# Patient Record
Sex: Female | Born: 1975
Health system: Southern US, Community
[De-identification: ages and names within clinical notes are randomized; demographics above are authoritative.]

## PROBLEM LIST (undated history)

## (undated) DIAGNOSIS — J329 Chronic sinusitis, unspecified: Secondary | ICD-10-CM

## (undated) DIAGNOSIS — G43909 Migraine, unspecified, not intractable, without status migrainosus: Secondary | ICD-10-CM

## (undated) DIAGNOSIS — N200 Calculus of kidney: Secondary | ICD-10-CM

## (undated) HISTORY — DX: Calculus of kidney: N20.0

## (undated) HISTORY — PX: ABDOMINAL HYSTERECTOMY: SHX81

## (undated) HISTORY — PX: NASAL SEPTUM SURGERY: SHX37

---

## 1998-02-06 ENCOUNTER — Other Ambulatory Visit: Admission: RE | Admit: 1998-02-06 | Discharge: 1998-02-06 | Payer: Self-pay | Admitting: Obstetrics and Gynecology

## 1998-03-27 ENCOUNTER — Ambulatory Visit (HOSPITAL_COMMUNITY): Admission: RE | Admit: 1998-03-27 | Discharge: 1998-03-27 | Payer: Self-pay | Admitting: Obstetrics and Gynecology

## 1999-10-08 ENCOUNTER — Emergency Department (HOSPITAL_COMMUNITY): Admission: EM | Admit: 1999-10-08 | Discharge: 1999-10-08 | Payer: Self-pay | Admitting: Emergency Medicine

## 2000-11-03 ENCOUNTER — Other Ambulatory Visit: Admission: RE | Admit: 2000-11-03 | Discharge: 2000-11-03 | Payer: Self-pay | Admitting: Obstetrics and Gynecology

## 2002-02-13 ENCOUNTER — Other Ambulatory Visit: Admission: RE | Admit: 2002-02-13 | Discharge: 2002-02-13 | Payer: Self-pay | Admitting: Obstetrics and Gynecology

## 2002-10-23 ENCOUNTER — Emergency Department (HOSPITAL_COMMUNITY): Admission: EM | Admit: 2002-10-23 | Discharge: 2002-10-23 | Payer: Self-pay | Admitting: Emergency Medicine

## 2003-04-13 ENCOUNTER — Other Ambulatory Visit: Admission: RE | Admit: 2003-04-13 | Discharge: 2003-04-13 | Payer: Self-pay | Admitting: Obstetrics and Gynecology

## 2003-06-15 ENCOUNTER — Emergency Department (HOSPITAL_COMMUNITY): Admission: EM | Admit: 2003-06-15 | Discharge: 2003-06-15 | Payer: Self-pay | Admitting: Emergency Medicine

## 2003-06-16 ENCOUNTER — Emergency Department (HOSPITAL_COMMUNITY): Admission: EM | Admit: 2003-06-16 | Discharge: 2003-06-16 | Payer: Self-pay | Admitting: Emergency Medicine

## 2003-09-02 ENCOUNTER — Emergency Department (HOSPITAL_COMMUNITY): Admission: AD | Admit: 2003-09-02 | Discharge: 2003-09-02 | Payer: Self-pay | Admitting: Internal Medicine

## 2003-09-03 ENCOUNTER — Ambulatory Visit (HOSPITAL_COMMUNITY): Admission: RE | Admit: 2003-09-03 | Discharge: 2003-09-03 | Payer: Self-pay | Admitting: Internal Medicine

## 2003-10-16 ENCOUNTER — Encounter (INDEPENDENT_AMBULATORY_CARE_PROVIDER_SITE_OTHER): Payer: Self-pay | Admitting: *Deleted

## 2003-10-16 ENCOUNTER — Ambulatory Visit (HOSPITAL_COMMUNITY): Admission: RE | Admit: 2003-10-16 | Discharge: 2003-10-16 | Payer: Self-pay | Admitting: Obstetrics and Gynecology

## 2004-08-12 ENCOUNTER — Observation Stay (HOSPITAL_COMMUNITY): Admission: RE | Admit: 2004-08-12 | Discharge: 2004-08-13 | Payer: Self-pay | Admitting: Obstetrics and Gynecology

## 2004-08-12 ENCOUNTER — Encounter (INDEPENDENT_AMBULATORY_CARE_PROVIDER_SITE_OTHER): Payer: Self-pay | Admitting: Specialist

## 2004-10-02 ENCOUNTER — Emergency Department (HOSPITAL_COMMUNITY): Admission: EM | Admit: 2004-10-02 | Discharge: 2004-10-02 | Payer: Self-pay | Admitting: Emergency Medicine

## 2006-01-27 ENCOUNTER — Emergency Department (HOSPITAL_COMMUNITY): Admission: EM | Admit: 2006-01-27 | Discharge: 2006-01-27 | Payer: Self-pay | Admitting: Emergency Medicine

## 2006-03-09 ENCOUNTER — Emergency Department (HOSPITAL_COMMUNITY): Admission: EM | Admit: 2006-03-09 | Discharge: 2006-03-10 | Payer: Self-pay | Admitting: Emergency Medicine

## 2006-03-12 ENCOUNTER — Emergency Department (HOSPITAL_COMMUNITY): Admission: EM | Admit: 2006-03-12 | Discharge: 2006-03-12 | Payer: Self-pay | Admitting: Emergency Medicine

## 2006-03-15 ENCOUNTER — Ambulatory Visit (HOSPITAL_COMMUNITY): Admission: RE | Admit: 2006-03-15 | Discharge: 2006-03-15 | Payer: Self-pay | Admitting: Urology

## 2006-07-16 ENCOUNTER — Emergency Department (HOSPITAL_COMMUNITY): Admission: EM | Admit: 2006-07-16 | Discharge: 2006-07-16 | Payer: Self-pay | Admitting: Emergency Medicine

## 2007-05-06 ENCOUNTER — Emergency Department (HOSPITAL_COMMUNITY): Admission: EM | Admit: 2007-05-06 | Discharge: 2007-05-06 | Payer: Self-pay | Admitting: Emergency Medicine

## 2008-08-09 ENCOUNTER — Emergency Department (HOSPITAL_COMMUNITY): Admission: EM | Admit: 2008-08-09 | Discharge: 2008-08-09 | Payer: Self-pay | Admitting: Family Medicine

## 2008-09-01 ENCOUNTER — Emergency Department (HOSPITAL_COMMUNITY): Admission: EM | Admit: 2008-09-01 | Discharge: 2008-09-01 | Payer: Self-pay | Admitting: Emergency Medicine

## 2008-10-03 ENCOUNTER — Emergency Department (HOSPITAL_COMMUNITY): Admission: EM | Admit: 2008-10-03 | Discharge: 2008-10-03 | Payer: Self-pay | Admitting: Family Medicine

## 2009-04-05 ENCOUNTER — Emergency Department (HOSPITAL_COMMUNITY): Admission: EM | Admit: 2009-04-05 | Discharge: 2009-04-05 | Payer: Self-pay | Admitting: Adult Health

## 2009-07-18 ENCOUNTER — Emergency Department (HOSPITAL_COMMUNITY): Admission: EM | Admit: 2009-07-18 | Discharge: 2009-07-18 | Payer: Self-pay | Admitting: Emergency Medicine

## 2010-08-26 ENCOUNTER — Emergency Department (HOSPITAL_COMMUNITY)
Admission: EM | Admit: 2010-08-26 | Discharge: 2010-08-26 | Payer: Self-pay | Source: Home / Self Care | Admitting: Family Medicine

## 2010-11-11 LAB — POCT RAPID STREP A (OFFICE): Streptococcus, Group A Screen (Direct): NEGATIVE

## 2010-11-25 LAB — POCT RAPID STREP A (OFFICE): Streptococcus, Group A Screen (Direct): NEGATIVE

## 2010-12-11 ENCOUNTER — Ambulatory Visit: Payer: Self-pay | Admitting: *Deleted

## 2010-12-26 NOTE — H&P (Signed)
Janet [name], Janet [name]            ACCOUNT NO.:  [id number]   MEDICAL RECORD NO.:  [id number]          PATIENT TYPE:  INP   LOCATION:  NA                            FACILITY:  [facility]   PHYSICIAN:  Dineen Kid. Rana Snare, M.D.    DATE OF BIRTH:  August 23, 1975   DATE OF ADMISSION:  08/12/2004  DATE OF DISCHARGE:                                HISTORY & PHYSICAL   HISTORY OF PRESENT ILLNESS:  Janet [name] is a 34 year old G1, P1 with  history of worsening pelvic pain that did not improve with previous tubal  ligation and laparoscopy in March 2005 which included lysis of adhesions and  ablation of endometriosis implants and also cystectomy.  She also has a  history of recurrent right ovarian cyst.  She does not tolerate birth  control pills and even the NuvaRing causes significant side effects.  She is  currently on Motrin and Darvocet and finds that she still has a significant  amount of pain.  She desires more definitive surgical intervention and  presents for laparoscopic assisted vaginal hysterectomy and also right  salpingo-oophorectomy.  She is frustrated and hurts all the time and is  missing work because of the pelvic pain and the discomfort.  She furthermore  has been offered other considerations such a Depo-Provera or the Evra Lupron  Depot or repeat laparoscopy but desires hysterectomy.   PAST MEDICAL HISTORY:  Her past medical history is significant for  depression and anxiety currently on 200 mg of Zoloft and 0.5 mg of Klonopin  b.i.d. as needed.   PAST SURGICAL HISTORY:  Past surgical history is significant for a  laparoscopy with lysis of adhesions, ovarian cystectomy, and bilateral tubal  ligation in March 2005.   PAST OBSTETRICAL HISTORY:  She had a vaginal birth of daughter Janet [name] in  20.   ALLERGIES:  She has allergy to CIPRO and MACROBID.   PHYSICAL EXAMINATION:  VITALS:  Blood pressure is 100/78, weight is 121.  HEART:  Regular in rhythm.  LUNGS:  Clear to auscultation  bilaterally.  ABDOMEN:  Nondistended, nontender.  PELVIC EXAM:  Uterus is anteverted, mobile, nontender, no adnexal masses are  palpable, no uterosacral nodularities palpable.   IMPRESSION AND PLAN:  Dysmenorrhea, pelvic pain, menometrorrhagia all not  responsive to conservative medical management which has included hormonal  manipulation as well as laparoscopy.  Patient desires definitive surgical  treatment planned vaginal hysterectomy with right salpingo-oophorectomy.  The risks and benefits were discussed at length.  Alternative treatments  were discussed at length.  Patient does give her informed consent and wished  to proceed.  The risks include, but are not limited to, risk of infection,  bleeding, damage to bowel, bladder, ureters, left ovary, possibility this  may not alleviate the pelvic pain, risks associated with surgery, risks  associated with blood transfusion.  She does give her informed consent and  wished to proceed.    Davi  DCL/MEDQ  D:  08/11/2004  T:  08/11/2004  Job:  [id number]

## 2010-12-26 NOTE — Op Note (Signed)
Janet, [name]            ACCOUNT NO.:  [id number]   MEDICAL RECORD NO.:  [id number]          PATIENT TYPE:  INP   LOCATION:  9399                          FACILITY:  [facility]   PHYSICIAN:  Dineen Kid. Rana Snare, M.D.    DATE OF BIRTH:  [birth year]   DATE OF PROCEDURE:  08/12/2004  DATE OF DISCHARGE:                                 OPERATIVE REPORT   PREOPERATIVE DIAGNOSES:  1.  Pelvic pain.  2.  Dysmenorrhea.  3.  Menometrorrhagia.   POSTOPERATIVE DIAGNOSES:  1.  Pelvic pain.  2.  Dysmenorrhea.  3.  Menometrorrhagia.  4.  Left ovarian cyst.  5.  Pelvic adhesions.   PROCEDURE:  Laparoscopic-assisted vaginal hysterectomy with right salpingo-  oophorectomy, left ovarian cystectomy, and lysis of adhesions.   SURGEON:  Dineen Kid. Rana Snare, M.D.   ASSISTANT:  Raynald Kemp, M.D.   ANESTHESIA:  General endotracheal.   INDICATIONS:  [name] is a 35 year old G1, P1, with worsening pelvic  pain which has not been improved by conservative management which has  included previous laparoscopies.  Currently on pain medication, including  Darvocet and Motrin.  She also has a history of right recurrent ovarian cyst  and cannot tolerate birth control pills.  She desires definitive surgical  intervention.  Plans laparoscopic-assisted vaginal hysterectomy with right  salpingo-oophorectomy and try to preserve the left ovary.  Risks and  benefits were discussed at length.  Informed consent was obtained.  Risks  include but are not limited to risk of infection, bleeding, damage to bowel,  bladder, ureters, left ovary, the possibility that this may not alleviate  the pelvic pain, risks associated with surgery, risks associated with  general anesthesia, and also blood transfusion.  She gives her informed  consent and wished to proceed.   FINDINGS AT THE TIME OF SURGERY:  Left ovarian cyst.  Left ovary was adhered  to the left pelvic sidewall and also the descending colon.  Right ovary did  appear to  be normal.  The uterus had several small fibroids, normal-  appearing appendix, normal-appearing liver.   DESCRIPTION OF PROCEDURE:  After adequate analgesia, the patient was placed  in the dorsal lithotomy position.  She was sterilely prepped and draped.  The bladder was sterilely drained.  A Hulka tenaculum was placed on the  cervix.  A 1 cm infraumbilical skin incision was made.  A Veress needle was  inserted.  The abdomen was insufflated, with dullness to percussion.  An 11  mm trocar was inserted.  A 5 mm trocar was inserted to the left of the  midline, two fingerbreadths above the pubic symphysis.  The above findings  were noted.  A __________ tripolar ligature was used to dissect and  coagulate the left ovary from the left pelvic sidewall.  Left ovarian  cystectomy was performed using the __________, with good hemostasis  achieved, and the descending colon adhesions were sharply lysed and made  hemostatic with the bipolar from the left tubo-ovarian complex, with good  hemostasis achieved, the left ovary now appearing essentially normal.  The  left broad ligament was dissected across  the uteroovarian ligament, down  across the round ligament using the tripolar ligature.  The right  infundibulopelvic ligament was ligated and dissected down across the round  ligament.  The uterovesical junction was noted, was elevated with a grasper,  and a small bladder flap was created.  The legs were repositioned.  The  abdomen was desufflated.  A weighted speculum was placed in the vagina.  A  Jacobs tenaculum was placed on the posterior cervix.  A posterior colpotomy  was performed.  The cervix was circumscribed with Bovie cautery.  A ligature  ligating instrument was used to coagulate across the uterosacral ligaments  bilaterally, dissected with Mayo scissors.  This was done similarly across  the cardinal ligaments.  Bladder pillars were similarly dissected.  The  bladder was dissected off the  anterior surface of the uterus.  Anterior  peritoneum was entered, and a Deaver retractor was placed.  Successive bites  up the inferior portion of the broad ligament were performed using the  ligature and Mayo scissors.  The uterine fundus was grasped, delivered down  to the introitus, and the remaining portion of the pedicles were clamped,  cut, and the uterus and the right ovary were removed.  The uterosacral  ligaments were identified, ligated with 0 Monocryl sutures in figure-of-  eight fashion.  The posterior peritoneum was then closed in a pursestring  fashion.  The vagina was then closed in a vertical fashion using figure-of-  eights and 0 Monocryl suture, with good approximation and good hemostasis.  Foley catheter was placed, with return of clear yellow urine.  The abdomen  was reinsufflated.  Legs were repositioned.  Bipolar cautery was used to  coagulate the peritoneal edges for good hemostasis, and after assurance of  hemostasis throughout the cul-de-sac and pelvis, the left ovary was further  examined, and after assuring hemostasis of the left ovarian cystectomy,  INTERCEED adhesion barrier was placed across the pelvic sidewall just below  the ovary in the left ovarian fossa.  The remaining portion of the INTERCEED  was placed across the anterior portion of the pelvic incision, covering the  anterior peritoneum.  The abdomen was desufflated.  Trocars were removed.  The previous scarring from the umbilical incision was sharply excised.  The  umbilical incision was closed with 0 Vicryl in an interrupted suture of the  fascia and a 3-0 Vicryl Rapide subcuticular stitch, with good approximation  and good hemostasis.  The 5 mm site was closed with two sutures of  subcuticular suture and interrupted suture of 3-0 Vicryl Rapide.  The  incisions were injected with 0.25% Marcaine, 10 cc total used.  The patient  tolerated the procedure well and was stable on transfer to the  recovery room.  Sponge and instrument count was normal x 3.   ESTIMATED BLOOD LOSS:  100 cc.   The patient received 1 g of Rocephin preoperatively.     Davi   DCL/MEDQ  D:  08/12/2004  T:  08/12/2004  Job:  [id number]

## 2010-12-26 NOTE — H&P (Signed)
[name], [name]                      ACCOUNT NO.:  [id number]   MEDICAL RECORD NO.:  [id number]                   PATIENT TYPE:  AMB   LOCATION:  SDC                                  FACILITY:  [facility]   PHYSICIAN:  Dineen Kid. Rana Snare, M.D.                 DATE OF BIRTH:  Mar 14, 1976   DATE OF ADMISSION:  10/16/2003  DATE OF DISCHARGE:                                HISTORY & PHYSICAL   HISTORY OF PRESENT ILLNESS:  Janet [name] complains of right-sided pain, going  on for the last three or four weeks, becoming more severe, currently taking  pain medication for this which is affecting her on a day-to-day basis.  She  has been to Dr. Annabell Howells for evaluation of this, does have a history of kidney  stones.  She underwent a CT scan and pelvic ultrasound showing a right  ovarian cyst.  She does have bilateral ovarian cysts with a largest on the  right measuring 3.1 cm in size.  No distal occlusion or renal calculi are  noted.  She does have several small kidney stones, however.  She also  adamantly desires sterilization.  She currently has a [place] IUD and has  achieved amenorrhea with IUD; however, wants to have a bilateral tubal  ligation performed at the same time ovarian cystectomy is performed.   PHYSICAL EXAMINATION:  HEART:  Regular rate and rhythm.  LUNGS:  Clear to auscultation bilaterally.  ABDOMEN:  Nondistended, nontender.  PELVIC:  The uterus is anteverted, mobile, nontender.  The right adnexa is  mildly tender to deep palpation and mildly full to deep palpation.  No  rebound or guarding is noted.  IUD string was in place.   IMPRESSION AND PLAN:  Right ovarian cyst.  Discussed different options with  her.  She does have Marina IUD.  She desires definitive surgical evaluation  and treatment of the right ovary and preferred right ovarian cystectomy.  She also has a history of pelvic pain and adhesions.  She also desires  sterilization and wants to proceed with this.  She would like to  leave the  [place] IUD in place, however, because she is doing well gynecologically and  not having any periods with this. She understands that cyst may recur.  There is also risk of injury to the uterus, tubes, ovaries, bowel, bladder.  The risk of tubal failure quoted at 5 out of 1000.  Risks associated with  anesthesia and  loss, possible laparotomy.  She does given her informed  consent.                                               Dineen Kid Rana Snare, M.D.    DCL/MEDQ  D:  10/15/2003  T:  10/15/2003  Job:  [id number]

## 2010-12-26 NOTE — Discharge Summary (Signed)
[name], [name]            ACCOUNT NO.:  [id number]   MEDICAL RECORD NO.:  [id number]          PATIENT TYPE:  INP   LOCATION:  9317                          FACILITY:  [facility]   PHYSICIAN:  Dineen Kid. Rana Snare, M.D.    DATE OF BIRTH:  1976/07/26   DATE OF ADMISSION:  08/12/2004  DATE OF DISCHARGE:  08/13/2004                                 DISCHARGE SUMMARY   HISTORY OF PRESENT ILLNESS:  [name] is a 35 year old G1, P1 with  worsening pelvic pain, that has not been improved with conservative medical  management, including previous laparoscopies.  She is currently on pain  medication, including Darvocet and Motrin.  She also has a history of right  ovarian cysts, which are recurrent.  Cannot tolerate birth control pills.  She desires definitive surgical intervention; plan hysterectomy with removal  of right tube and ovary.   The risks and benefits were discussed, informed consent was obtained.  See  the history and physical for further details.   HOSPITAL COURSE:  The patient underwent laparoscopically-assisted vaginal  hysterectomy with right salpingo-oophorectomy.  At the time of surgery it  was also noted to have a left ovarian cyst.  Cystectomy was performed.  There were also adhesions which were removed.   The surgery was uncomplicated, with estimated blood loss of 100 cc.  The  patient's postoperative course was similarly uncomplicated, with quick  return of bowel function and the patient was ambulating without difficulty.  She was tolerating a regular diet by postoperative day #1.  Her incision was  clean and dry and intact.  The patient was discharged home; to follow in the  office in  1-2 weeks.   DISPOSITION:  The patient will follow up in the office in 1-2 weeks.  She is  sent home with a prescription for Darvocet #30.  Told to return for  increased pain, fever or bleeding.   Her postoperative hemoglobin was 11.3.     Davi   DCL/MEDQ  D:  08/13/2004  T:   08/13/2004  Job:  [id number]

## 2010-12-26 NOTE — Op Note (Signed)
Janet [name], Janet [name]                      ACCOUNT NO.:  [id number]   MEDICAL RECORD NO.:  [id number]                   PATIENT TYPE:  AMB   LOCATION:  SDC                                  FACILITY:  [facility]   PHYSICIAN:  Dineen Kid. Rana Snare, M.D.                 DATE OF BIRTH:  March 15, 1976   DATE OF PROCEDURE:  10/16/2003  DATE OF DISCHARGE:                                 OPERATIVE REPORT   PREOPERATIVE DIAGNOSES:  1. Bilateral ovarian cysts.  2. Pelvic pain.  3. History of adhesive disease.  4. Desires sterility.   POSTOPERATIVE DIAGNOSES:  1. Pelvic pain.  2. Bilateral ovarian cysts.  3. Adhesions.  4. Mild endometriosis.  5. Desires sterility.   PROCEDURES:  1. Laparoscopy with drainage of right ovarian cyst and left ovarian     cystectomy.  2. Left salpingectomy.  3. Right tubal ligation.  4. Lysis of adhesions.  5. Ablation of endometriosis implants.   SURGEON:  Dineen Kid. Rana Snare, M.D.   ANESTHESIA:  General endotracheal.   ESTIMATED BLOOD LOSS:  Less than 50 mL.   INDICATIONS:  [name] is a 35 year old G2, P2, with worsening pelvic  pain, who recently had a CT scan done which showed a right ovarian cyst.  A  follow-up ultrasound showed bilateral ovarian cysts with the largest on the  right side, measuring 3.1 cm.  She has a history of pelvic adhesions and  because of worsening pain, she also desires sterilization despite having a  Mirena IUD in place.  She wished to proceed with laparoscopic evaluation of  the pelvic pain and at the same time proceed with bilateral tubal ligation.  Risks and benefits were discussed at length, which include but are not  limited to risk of infection, bleeding, damage to uterus, tubes, and  ovaries, bowel and bladder, possibility of recurrence of pelvic pain or not  being able to alleviate the pelvic pain.  The patient was also told that the  failure rate of tubal ligation is 5 out of 1000.  She does give her informed  consent and  wishes to proceed.   FINDINGS AT THE TIME OF SURGERY:  A normal-appearing appendix and liver.  The right ovary has a small follicular cyst, which was easily drained.  There were several small endometriosis implants in the right ovarian fossa.  A small 2-3 cm in the posterior cervix.  A normal-appearing cul-de-sac and  bladder.  The left ovarian-tubal complex was adhered to the pelvic sidewall  and to the bowel.  The cyst was moderately enlarged with two cysts, one  hemorrhagic and one serous in nature.   DESCRIPTION OF PROCEDURE:  After adequate analgesia, the patient was placed  in the dorsal lithotomy position.  She was sterilely prepped and draped.  The bladder was sterilely drained.  A Graves speculum was placed and the  tenaculum was placed on the anterior lip of the cervix.  A 1 cm  infraumbilical skin incision was made, a Veress needle was inserted.  The  abdomen was insufflated to dullness to percussion.  An 11 mm trocar was  inserted.  A 5 mm trocar was inserted to the left of midline two  fingerbreadths above the pubic symphysis under direct visualization.  The  above findings were noted.  The right ovary was identified.  A small  follicular cyst was identified, and bipolar cautery was used to open the  cyst with drainage of follicular fluid.  Because this appeared to be a very  simple follicular cyst, no further excision of the cyst wall was indicated.  There were several small areas of endometriosis implants underneath the  right ovary in the ovarian fossa.  These were ablated with bipolar cautery,  good thermal burn noted through the entirety of the peritoneal implant with  care taken to avoid the underlying ureter.  The right fallopian tube was  then identified.  A midportion was grasped and cauterization was carried out  across approximately a 2-3 cm section of the fallopian tube with a good  thermal burn noted across the entirety of the tube.  Examination of the left   tubo-ovarian complex revealed a marked amount of bowel adhesions to the tube  and the ovary.  These were dissected with Endoshears and bipolar cautery.  The fallopian tube was densely adhered to the bowel as well as the ovary.  This was sharply freed up with Endoshears and hemostasis achieved with  bipolar cautery.  However, because the fallopian tube mesosalpinx was  markedly traumatized due to the adhesions, a decision was made to proceed  with a partial salpingectomy on the right side.  The cautery was then  carried up the mesosalpinx to the midportion of the tube, where after  cauterization the fallopian tube was excised.  The ovary was then freed from  the bowel and part of the pelvic sidewall using Endoshears and bipolar  cautery with care taken not to undermine the infundibulopelvic ligaments and  blood supply or undermine ureter.  The ovarian cysts were grasped and  bipolar cautery was used to make a small window.  Serous fluid was removed.  The wall from the serous cyst was grasped with graspers and delivered  through the incision.  Hemostasis was achieved with bipolar cautery around  the opening of the serous cyst and most of the cyst wall was removed and  sent to pathology.  A second cyst within another part of the ovary was  identified.  A small hole was made through the exterior surface of the ovary  with bipolar cautery.  It appeared to be a hemorrhagic cyst with graspers  removing a portion of clot and cyst wall.  Hemostasis was achieved with  bipolar cautery.  Re-examination of the pelvis and cul-de-sac at this point  appeared to have been restored to normal.  After a copious amount of  irrigation, adequate hemostasis was assured.  Interceed adhesion barrier was  placed along the left pelvic sidewall where the adhesions were removed and  the left ovary was also wrapped with Interceed to help prevent adhesions. The abdomen was then desufflated, the trocars removed.  The  infraumbilical  skin incision was closed with a 0 Vicryl figure-of-eight in the fascia, a 3-  0 Vicryl Rapide subcuticular stitch.  The 5 mm site was closed with a 3-0  Vicryl Rapide subcuticular stitch and also a horizontal mattress of 3-0  Vicryl Rapide.  The  incisions were injected with 0.25% Marcaine 10 mL total  used.  The tenaculum was removed from the cervix and noted to be hemostatic.  The patient tolerated the procedure well, was stable on transfer to the  recovery room.  Sponge and instrument count was normal x3.  Estimated blood  loss was less than 50 mL.   DISPOSITION:  The patient will be discharged home with follow-up in the  office in two to three weeks, sent home with a routine instruction sheet for  laparoscopy.  Told to return for increased pain, fever, or bleeding.  The  patient did receive 1 g of Cefotetan preoperatively.                                               Dineen Kid Rana Snare, M.D.    DCL/MEDQ  D:  10/16/2003  T:  10/16/2003  Job:  [id number]

## 2011-05-15 LAB — POCT RAPID STREP A: Streptococcus, Group A Screen (Direct): NEGATIVE

## 2011-05-21 LAB — URINALYSIS, ROUTINE W REFLEX MICROSCOPIC
Bilirubin Urine: NEGATIVE
Glucose, UA: NEGATIVE
Hgb urine dipstick: NEGATIVE
Ketones, ur: NEGATIVE
Leukocytes, UA: NEGATIVE
Nitrite: NEGATIVE
Protein, ur: 30 — AB
Specific Gravity, Urine: 1.019
Urobilinogen, UA: 1
pH: 6

## 2011-05-21 LAB — URINE MICROSCOPIC-ADD ON

## 2011-05-21 LAB — PREGNANCY, URINE: Preg Test, Ur: NEGATIVE

## 2012-01-08 ENCOUNTER — Other Ambulatory Visit: Payer: Self-pay | Admitting: Family Medicine

## 2012-01-08 DIAGNOSIS — J329 Chronic sinusitis, unspecified: Secondary | ICD-10-CM

## 2012-01-11 ENCOUNTER — Other Ambulatory Visit (HOSPITAL_COMMUNITY): Payer: Self-pay

## 2012-01-11 ENCOUNTER — Ambulatory Visit: Payer: Self-pay | Admitting: Family Medicine

## 2012-01-27 ENCOUNTER — Ambulatory Visit: Payer: Self-pay | Admitting: Otolaryngology

## 2012-02-04 ENCOUNTER — Inpatient Hospital Stay: Payer: Self-pay | Admitting: Internal Medicine

## 2012-02-04 LAB — COMPREHENSIVE METABOLIC PANEL
Albumin: 2.7 g/dL — ABNORMAL LOW (ref 3.4–5.0)
Alkaline Phosphatase: 116 U/L (ref 50–136)
Bilirubin,Total: 0.5 mg/dL (ref 0.2–1.0)
Calcium, Total: 8 mg/dL — ABNORMAL LOW (ref 8.5–10.1)
Chloride: 98 mmol/L (ref 98–107)
EGFR (African American): >60
EGFR (Non-African Amer.): >60
Glucose: 115 mg/dL — ABNORMAL HIGH (ref 65–99)
Potassium: 2.8 mmol/L — ABNORMAL LOW (ref 3.5–5.1)

## 2012-02-04 LAB — CBC
MCH: 30 pg (ref 26.0–34.0)
MCHC: 35.1 g/dL (ref 32.0–36.0)
MCV: 86 fL (ref 80–100)
Platelet: 206 10*3/uL (ref 150–440)
RBC: 4.56 10*6/uL (ref 3.80–5.20)
WBC: 27.2 10*3/uL — ABNORMAL HIGH (ref 3.6–11.0)

## 2012-02-04 LAB — URINALYSIS, COMPLETE
Blood: NEGATIVE
Glucose,UR: NEGATIVE mg/dL (ref 0–75)
Leukocyte Esterase: NEGATIVE
Nitrite: NEGATIVE
Ph: 5 (ref 4.5–8.0)
Protein: 30

## 2012-02-04 LAB — APTT: Activated PTT: 27.6 secs (ref 23.6–35.9)

## 2012-02-04 LAB — LIPASE, BLOOD: Lipase: 96 U/L (ref 73–393)

## 2012-02-04 LAB — CLOSTRIDIUM DIFFICILE BY PCR

## 2012-02-04 LAB — PROTIME-INR: Prothrombin Time: 13.9 secs (ref 11.5–14.7)

## 2012-02-05 LAB — BASIC METABOLIC PANEL
Anion Gap: 11 (ref 7–16)
Chloride: 106 mmol/L (ref 98–107)
Co2: 22 mmol/L (ref 21–32)
Creatinine: 0.61 mg/dL (ref 0.60–1.30)
EGFR (African American): >60
Glucose: 86 mg/dL (ref 65–99)
Osmolality: 274 (ref 275–301)
Sodium: 139 mmol/L (ref 136–145)

## 2012-02-05 LAB — CBC WITH DIFFERENTIAL/PLATELET
HCT: 32 % — ABNORMAL LOW (ref 35.0–47.0)
MCH: 29.7 pg (ref 26.0–34.0)
Platelet: 175 10*3/uL (ref 150–440)
RBC: 3.71 10*6/uL — ABNORMAL LOW (ref 3.80–5.20)
RDW: 12.8 % (ref 11.5–14.5)
Segmented Neutrophils: 81 %
WBC: 23.5 10*3/uL — ABNORMAL HIGH (ref 3.6–11.0)

## 2012-02-05 LAB — MAGNESIUM: Magnesium: 1.3 mg/dL — ABNORMAL LOW

## 2012-02-06 LAB — BASIC METABOLIC PANEL
Anion Gap: 8 (ref 7–16)
Calcium, Total: 7.6 mg/dL — ABNORMAL LOW (ref 8.5–10.1)
Chloride: 106 mmol/L (ref 98–107)
Creatinine: 0.6 mg/dL (ref 0.60–1.30)
Glucose: 91 mg/dL (ref 65–99)
Osmolality: 272 (ref 275–301)
Sodium: 138 mmol/L (ref 136–145)

## 2012-02-06 LAB — CBC WITH DIFFERENTIAL/PLATELET
Eosinophil %: 1.3 %
HGB: 11.1 g/dL — ABNORMAL LOW (ref 12.0–16.0)
Lymphocyte #: 1.9 10*3/uL (ref 1.0–3.6)
MCH: 30.3 pg (ref 26.0–34.0)
MCHC: 34.8 g/dL (ref 32.0–36.0)
Monocyte #: 2.1 x10 3/mm — ABNORMAL HIGH (ref 0.2–0.9)
Monocyte %: 10.6 %
Neutrophil %: 77.9 %
Platelet: 177 10*3/uL (ref 150–440)
RDW: 13.3 % (ref 11.5–14.5)
WBC: 19.8 10*3/uL — ABNORMAL HIGH (ref 3.6–11.0)

## 2012-02-06 LAB — STOOL CULTURE

## 2012-02-07 LAB — POTASSIUM: Potassium: 3 mmol/L — ABNORMAL LOW (ref 3.5–5.1)

## 2012-02-07 LAB — WBC: WBC: 10.1 10*3/uL (ref 3.6–11.0)

## 2012-02-09 LAB — CULTURE, BLOOD (SINGLE)

## 2012-06-20 ENCOUNTER — Emergency Department (HOSPITAL_COMMUNITY)
Admission: EM | Admit: 2012-06-20 | Discharge: 2012-06-21 | Disposition: A | Discharge status: Home Health | Payer: Medicaid Other | Attending: Emergency Medicine | Admitting: Emergency Medicine

## 2012-06-20 ENCOUNTER — Encounter (HOSPITAL_COMMUNITY): Payer: Self-pay | Admitting: *Deleted

## 2012-06-20 DIAGNOSIS — Z8709 Personal history of other diseases of the respiratory system: Secondary | ICD-10-CM | POA: Insufficient documentation

## 2012-06-20 DIAGNOSIS — R42 Dizziness and giddiness: Secondary | ICD-10-CM | POA: Insufficient documentation

## 2012-06-20 DIAGNOSIS — R519 Headache, unspecified: Secondary | ICD-10-CM

## 2012-06-20 DIAGNOSIS — Z8669 Personal history of other diseases of the nervous system and sense organs: Secondary | ICD-10-CM | POA: Insufficient documentation

## 2012-06-20 DIAGNOSIS — R110 Nausea: Secondary | ICD-10-CM | POA: Insufficient documentation

## 2012-06-20 DIAGNOSIS — J3489 Other specified disorders of nose and nasal sinuses: Secondary | ICD-10-CM | POA: Insufficient documentation

## 2012-06-20 DIAGNOSIS — Z79899 Other long term (current) drug therapy: Secondary | ICD-10-CM | POA: Insufficient documentation

## 2012-06-20 HISTORY — DX: Chronic sinusitis, unspecified: J32.9

## 2012-06-20 HISTORY — DX: Migraine, unspecified, not intractable, without status migrainosus: G43.909

## 2012-06-20 NOTE — ED Notes (Signed)
Pt states that she has a hx of sinusitis and has been on antibiotics for a few weeks. Pt states that she had septum surgery a week ago was in the hospital and was diagnosed with C-diff. Pt taking antibiotics for c-diff and still having a HA. Pt states that she is worried about her sinuses and she started having migraines again after her nasal surgery.

## 2012-06-21 MED ORDER — ONDANSETRON HCL 4 MG PO TABS: 4.0000 mg | ORAL_TABLET | Freq: Four times a day (QID) | ORAL | Status: DC | PRN

## 2012-06-21 MED ORDER — OXYCODONE-ACETAMINOPHEN 5-325 MG PO TABS
1.0000 | ORAL_TABLET | Freq: Once | ORAL | Status: AC
  Administered 2012-06-21: 1 via ORAL
  Filled 2012-06-21: qty 1

## 2012-06-21 MED ORDER — PERCOCET 5-325 MG PO TABS: 1.0000 | ORAL_TABLET | Freq: Four times a day (QID) | ORAL | Status: DC | PRN

## 2012-06-21 MED ORDER — ONDANSETRON 4 MG PO TBDP
8.0000 mg | ORAL_TABLET | Freq: Once | ORAL | Status: AC
  Administered 2012-06-21: 8 mg via ORAL
  Filled 2012-06-21: qty 2

## 2012-06-21 NOTE — ED Provider Notes (Signed)
  Physical Exam  BP 115/84  Pulse 65  Temp 97.8 F (36.6 C) (Oral)  Resp 14  SpO2 100%  Physical Exam In no acute distress states HA improved after medication  ED Course  Procedures  MDMwill DC home with RX for pain control adn office ENT FU      Arman Filter, NP 06/21/12 [id number]

## 2012-06-21 NOTE — ED Provider Notes (Signed)
History     CSN: [id number]  Arrival date & time 06/20/12  2342   First MD Initiated Contact with Patient 06/21/12 0011      Chief Complaint  Patient presents with  . Headache    (Consider location/radiation/quality/duration/timing/severity/associated sxs/prior treatment) HPI Comments: 36 year old female with a history of sinusitis and migraines presents to the emergency department complaining of sinus pressure and headache.  Note that she had a septoplasty performed in June by Dr. Roney Mans at [facility] and not her symptoms have been followed by Dr. Tanya Nones her primary care physician.  Onset of symptoms began about 3-4 weeks ago and are described as pressure in both the frontal maxillary sinuses.  Patient has been treating her chronic sinusitis with Omnicef and Flonase without any relief.  Patient states that she is concerned because she has not had migraines since her surgery in June, however now they have returned. HAs have been intermittent and today's began at around 11 am, gradually worsening & mildly relieved by NSAIDs. Associated symptoms include nausea, ear popping and nasal congestion, but pt denies fever, NS, chills, cough, hearing loss, change in vision, dizziness, nuchal rigidity or trauma.   Patient is a 36 y.o. female presenting with headaches. The history is provided by the patient.  Headache  Associated symptoms include nausea. Pertinent negatives include no fever, no shortness of breath and no vomiting.    Past Medical History  Diagnosis Date  . Migraines   . Sinusitis     Past Surgical History  Procedure Date  . Nasal septum surgery     septum plasy [place]    History reviewed. No pertinent family history.  History  Substance Use Topics  . Smoking status: Never Smoker   . Smokeless tobacco: Not on file  . Alcohol Use: No    OB History    Grav Para Term Preterm Abortions TAB SAB Ect Mult Living                  Review of Systems  Constitutional:  Negative for fever, chills and appetite change.  HENT: Positive for congestion and sinus pressure. Negative for hearing loss, ear pain, sore throat, trouble swallowing, neck pain, neck stiffness, dental problem and tinnitus.   Eyes: Negative for visual disturbance.  Respiratory: Negative for shortness of breath.   Cardiovascular: Negative for chest pain and leg swelling.  Gastrointestinal: Positive for nausea. Negative for vomiting and abdominal pain.  Genitourinary: Negative for dysuria, urgency and frequency.  Skin: Negative for rash.  Neurological: Positive for headaches. Negative for dizziness, syncope, weakness, light-headedness and numbness.  Psychiatric/Behavioral: Negative for confusion.  All other systems reviewed and are negative.    Allergies  Sulfa antibiotics  Home Medications   Current Outpatient Rx  Name  Route  Sig  Dispense  Refill  . AMPHETAMINE-DEXTROAMPHETAMINE 20 MG PO TABS   Oral   Take 20 mg by mouth 3 (three) times daily as needed. For attention         . CEFDINIR 300 MG PO CAPS   Oral   Take 300 mg by mouth 2 (two) times daily.         Marland Kitchen CLONAZEPAM 2 MG PO TABS   Oral   Take 2 mg by mouth 3 (three) times daily as needed. For anxiety         . SERTRALINE HCL 100 MG PO TABS   Oral   Take 150 mg by mouth daily.  BP 115/84  Pulse 65  Temp 97.8 F (36.6 C) (Oral)  Resp 14  SpO2 100%  Physical Exam  Nursing note and vitals reviewed. Constitutional: She is oriented to person, place, and time. She appears well-developed and well-nourished. She appears distressed.  HENT:  Head: Normocephalic and atraumatic.       L&R TM nl, no mastoid erythema or tenderness. TTP of frontal & maxillary sinuses. Uvula midline, no oropharynx exudate.   Eyes: Conjunctivae normal and EOM are normal.  Neck: Normal range of motion.       Supple, pain free ROM  Cardiovascular:       RRR   Pulmonary/Chest: Effort normal.       LCAB  Musculoskeletal:  Normal range of motion.  Neurological: She is alert and oriented to person, place, and time.       CN III-XII intact. Normal gait and coordination.   Skin: Skin is warm and dry. No rash noted. She is not diaphoretic.  Psychiatric: She has a normal mood and affect. Her behavior is normal.    ED Course  Procedures (including critical care time)  Labs Reviewed - No data to display No results found.   No diagnosis found.  BP 115/84  Pulse 65  Temp 97.8 F (36.6 C) (Oral)  Resp 14  SpO2 100%   MDM  Sinus pressure & HA  36 yo F w hx of sinusitis and migraines currently being followed by PCP and ENT presents to ER for worsening HA. SHe is currently being treated w Omnicef and flonase. Pt HA treated and improved while in ED.  Presentation is like pts typical HA associated w sinus pressure and non concerning for [facility], ICH, Meningitis, or temporal arteritis. Pt is afebrile with no focal neuro deficits, nuchal rigidity, or change in vision. Pt is to follow up with PCP & ENT. Pt verbalizes understanding and is agreeable with plan to dc.         Jaci Carrel, [place] 06/21/12 [phone]

## 2012-06-22 NOTE — ED Provider Notes (Signed)
Medical screening examination/treatment/procedure(s) were performed by non-physician practitioner and as supervising physician I was immediately available for consultation/collaboration.  Kaleya Douse L Bethanie Bloxom, MD 06/22/12 0046 

## 2012-06-22 NOTE — ED Provider Notes (Signed)
Medical screening examination/treatment/procedure(s) were performed by non-physician practitioner and as supervising physician I was immediately available for consultation/collaboration.  Flint Melter, MD 06/22/12 [phone]

## 2012-06-23 ENCOUNTER — Ambulatory Visit: Payer: Self-pay | Admitting: Otolaryngology

## 2013-01-12 ENCOUNTER — Ambulatory Visit (INDEPENDENT_AMBULATORY_CARE_PROVIDER_SITE_OTHER): Payer: Medicaid Other | Admitting: Physician Assistant

## 2013-01-12 ENCOUNTER — Encounter: Payer: Self-pay | Admitting: Physician Assistant

## 2013-01-12 VITALS — BP 110/66 | HR 92 | Temp 97.6°F | Resp 20 | Ht 63.0 in | Wt 128.0 lb

## 2013-01-12 DIAGNOSIS — H109 Unspecified conjunctivitis: Secondary | ICD-10-CM

## 2013-01-12 MED ORDER — POLYMYXIN B-TRIMETHOPRIM 10000-0.1 UNIT/ML-% OP SOLN: 1.0000 [drp] | OPHTHALMIC | Status: DC

## 2013-01-12 MED ORDER — AZITHROMYCIN 1 % OP SOLN: 1.0000 [drp] | Freq: Two times a day (BID) | OPHTHALMIC | Status: DC

## 2013-01-12 NOTE — Progress Notes (Signed)
Pharmacy called problem with filling RX ordered.  New order sent per provider.

## 2013-01-12 NOTE — Addendum Note (Signed)
Addended by: Donne Anon on: 01/12/2013 05:12 PM   Modules accepted: Orders

## 2013-01-12 NOTE — Progress Notes (Signed)
   Patient ID: Janet [name] MRN: [id number], DOB: [birth year], 37 y.o. Date of Encounter: 01/12/2013, 4:19 PM    Chief Complaint:  Chief Complaint  Patient presents with  . c/o pink eye vs allergies     HPI: 37 y.o. year old female reports that right eye feels irritated and feels like "a glaze over it". Eyelashes have been stuck together in the mornings for past 2 days. Constantly wiping them clean b/c of drainage on eyes and eyelashes.  Has had no sneezing or rhinorrhea. No nasal mucus. No chest congestion/cough. No sore throat or fever.  Is taking Zyrtec but niot helping.   Home Meds: See attached medication section for any medications that were entered at today's visit. The computer does not put those onto this list.The following list is a list of meds entered prior to today's visit.   Current Outpatient Prescriptions on File Prior to Visit  Medication Sig Dispense Refill  . amphetamine-dextroamphetamine (ADDERALL) 20 MG tablet Take 20 mg by mouth 3 (three) times daily as needed. For attention      . clonazePAM (KLONOPIN) 2 MG tablet Take 2 mg by mouth 3 (three) times daily as needed. For anxiety      . ondansetron (ZOFRAN) 4 MG tablet Take 1 tablet (4 mg total) by mouth every 6 (six) hours as needed for nausea.  6 tablet  0   No current facility-administered medications on file prior to visit.    Allergies:  Allergies  Allergen Reactions  . Clindamycin/Lincomycin   . Sulfa Antibiotics Nausea And Vomiting and Rash      Review of Systems: See HPI for pertinent ROS. All other ROS negative.    Physical Exam: Blood pressure 110/66, pulse 92, temperature 97.6 F (36.4 C), temperature source Oral, resp. rate 20, height 5\' 3"  (1.6 m), weight 128 lb (58.06 kg)., Body mass index is 22.68 kg/(m^2). General: WNWD WF. Appears in no acute distress. HEENT: Normocephalic, atraumatic,Conjunctiva with minimal injection, pink coloration. There is some golden crust stuck to eyelashes  even though she says she just wipe dthem clean. Nares are without discharge. Bilateral auditory canals clear, TM's are without perforation, pearly grey and translucent with reflective cone of light bilaterally. Oral cavity moist, posterior pharynx without exudate, erythema, peritonsillar abscess, or post nasal drip.  Neck: Supple. No thyromegaly. No lymphadenopathy. Lungs: Clear bilaterally to auscultation without wheezes, rales, or rhonchi. Breathing is unlabored. Heart: Regular rhythm. No murmurs, rubs, or gallops. Msk:  Strength and tone normal for age. Neuro: Alert and oriented X 3. Moves all extremities spontaneously. Gait is normal. CNII-XII grossly in tact. Psych:  Responds to questions appropriately with a normal affect.     ASSESSMENT AND PLAN:  37 y.o. year old female with  1. Conjunctivitis She is allergic to clindamycin and sulfa meds.  - azithromycin (AZASITE) 1 % ophthalmic solution; Place 1 drop into both eyes 2 (two) times daily. One drop in both eyes twice daily for 2 days then 1 drop daily for 5 more days  Dispense: 2.5 mL; Refill: 0 F/U if symptoms worsen/do not resolve.  [address] [place], [place], [facility] 01/12/2013 4:19 PM

## 2013-03-23 ENCOUNTER — Ambulatory Visit
Admission: RE | Admit: 2013-03-23 | Discharge: 2013-03-23 | Disposition: A | Discharge status: Home / Self Care | Payer: Medicaid Other | Source: Ambulatory Visit | Attending: Family Medicine | Admitting: Family Medicine

## 2013-03-23 ENCOUNTER — Encounter: Payer: Self-pay | Admitting: Family Medicine

## 2013-03-23 ENCOUNTER — Ambulatory Visit (INDEPENDENT_AMBULATORY_CARE_PROVIDER_SITE_OTHER): Payer: Medicaid Other | Admitting: Family Medicine

## 2013-03-23 ENCOUNTER — Other Ambulatory Visit: Payer: Self-pay | Admitting: Family Medicine

## 2013-03-23 VITALS — BP 110/64 | HR 80 | Temp 97.9°F | Resp 16 | Wt 133.0 lb

## 2013-03-23 DIAGNOSIS — R091 Pleurisy: Secondary | ICD-10-CM

## 2013-03-23 DIAGNOSIS — M546 Pain in thoracic spine: Secondary | ICD-10-CM

## 2013-03-23 DIAGNOSIS — M545 Low back pain, unspecified: Secondary | ICD-10-CM

## 2013-03-23 DIAGNOSIS — M4120 Other idiopathic scoliosis, site unspecified: Secondary | ICD-10-CM

## 2013-03-23 MED ORDER — MELOXICAM 15 MG PO TABS: 15.0000 mg | ORAL_TABLET | Freq: Every day | ORAL | Status: DC

## 2013-03-23 MED ORDER — CYCLOBENZAPRINE HCL 10 MG PO TABS: 10.0000 mg | ORAL_TABLET | Freq: Three times a day (TID) | ORAL | Status: DC | PRN

## 2013-03-23 NOTE — Telephone Encounter (Signed)
Med sent to pharm 

## 2013-03-23 NOTE — Progress Notes (Signed)
Subjective:    Patient ID: Janet [name], female    DOB: [birth year], 37 y.o.   MRN: [id number]  HPI Patient reports 4-5 months of worsening pain in her back. It is located the level of the seventh thoracic vertebrae. It mainly hurts her at night when she tries to read over in bed. Recently it has become constant he itches also having pleurisy. It hurts worse when she takes a deep breath in or tries to twist or turn. There is no angina. There is no shortness of breath. There is no moccasins or coughing. There no fevers chills or night sweats. She denies any saddle anesthesia or symptoms of lumbar radiculopathy. The pain is sharp and intense and located along the sides of the thoracic vertebral column. Past Medical History  Diagnosis Date  . Migraines   . Sinusitis    Current Outpatient Prescriptions on File Prior to Visit  Medication Sig Dispense Refill  . amphetamine-dextroamphetamine (ADDERALL) 20 MG tablet Take 20 mg by mouth 3 (three) times daily as needed. For attention      . clonazePAM (KLONOPIN) 2 MG tablet Take 2 mg by mouth 3 (three) times daily as needed. For anxiety      . ondansetron (ZOFRAN) 4 MG tablet Take 1 tablet (4 mg total) by mouth every 6 (six) hours as needed for nausea.  6 tablet  0  . QUEtiapine (SEROQUEL) 300 MG tablet Take 300 mg by mouth at bedtime.      Marland Kitchen trimethoprim-polymyxin b (POLYTRIM) ophthalmic solution Place 1 drop into both eyes every 4 (four) hours.  10 mL  0   No current facility-administered medications on file prior to visit.   Allergies  Allergen Reactions  . Clindamycin/Lincomycin   . Sulfa Antibiotics Nausea And Vomiting and Rash   History   Social History  . Marital Status: Widowed    Spouse Name: N/A    Number of Children: N/A  . Years of Education: N/A   Occupational History  . Not on file.   Social History Main Topics  . Smoking status: Current Every Day Smoker  . Smokeless tobacco: Never Used  . Alcohol Use: No  . Drug  Use: No  . Sexual Activity: Not on file   Other Topics Concern  . Not on file   Social History Narrative  . No narrative on file      Review of Systems  All other systems reviewed and are negative.       Objective:   Physical Exam  Vitals reviewed. Constitutional: She is oriented to person, place, and time.  Cardiovascular: Normal rate, regular rhythm and intact distal pulses.  Exam reveals no gallop and no friction rub.   No murmur heard. Pulmonary/Chest: Effort normal and breath sounds normal. No respiratory distress. She has no wheezes. She has no rales. She exhibits no tenderness.  Abdominal: Soft. Bowel sounds are normal. She exhibits no distension. There is no tenderness.  Musculoskeletal:       Thoracic back: She exhibits decreased range of motion, tenderness, bony tenderness and spasm.       Lumbar back: She exhibits tenderness. She exhibits normal range of motion, no bony tenderness and no spasm.  Neurological: She is alert and oriented to person, place, and time. She has normal reflexes. She displays normal reflexes. No cranial nerve deficit. She exhibits normal muscle tone. Coordination normal.          Assessment & Plan:  1. Back pain, thoracic I suspect thoracic  paraspinal muscle strain.  Obtain thoracic spine x-ray. If the x-ray is negative recommend muscle asked her to physical therapy. - DG Thoracic Spine W/Swimmers; Future  2. Lumbar back pain Obtain a lumbar spine x-ray. I suspect lumbar paraspinal muscle strain. We'll recommend physical therapy and muscle relaxers as negative. - DG Lumbar Spine Complete; Future  3. Pleurisy I suspect this is musculoskeletal in nature. Given the fact she is a smoker of 10 a chest x-ray to rule out any pulmonary pathology. - DG Chest 2 View; Future

## 2013-04-21 ENCOUNTER — Ambulatory Visit: Payer: Medicaid Other | Admitting: Family Medicine

## 2013-04-25 ENCOUNTER — Other Ambulatory Visit: Payer: Self-pay | Admitting: Family Medicine

## 2013-04-25 NOTE — Telephone Encounter (Signed)
Ok to refill 

## 2013-04-25 NOTE — Telephone Encounter (Signed)
ok 

## 2013-05-02 ENCOUNTER — Ambulatory Visit (INDEPENDENT_AMBULATORY_CARE_PROVIDER_SITE_OTHER): Payer: Medicaid Other | Admitting: Family Medicine

## 2013-05-02 ENCOUNTER — Encounter: Payer: Self-pay | Admitting: Family Medicine

## 2013-05-02 VITALS — BP 110/62 | HR 88 | Temp 97.9°F | Resp 16 | Ht 64.0 in | Wt 136.0 lb

## 2013-05-02 DIAGNOSIS — T300 Burn of unspecified body region, unspecified degree: Secondary | ICD-10-CM

## 2013-05-02 MED ORDER — SILVER SULFADIAZINE 1 % EX CREA: TOPICAL_CREAM | Freq: Every day | CUTANEOUS | Status: DC

## 2013-05-02 NOTE — Progress Notes (Signed)
  Subjective:    Patient ID: Janet [name], female    DOB: 04/19/1976, 37 y.o.   MRN: [id number]  HPI  One week ago the patient burned the dorsum of her left wrist on a steam vacuum cleaner.  She has a 3 cm x 3 cm erythematous patch with a large line containing serous fluid in that area. The area is. There is no evidence of cellulitis or infection. She is here today for evaluation. Past Medical History  Diagnosis Date  . Migraines   . Sinusitis    Current Outpatient Prescriptions on File Prior to Visit  Medication Sig Dispense Refill  . amphetamine-dextroamphetamine (ADDERALL) 20 MG tablet Take 20 mg by mouth 3 (three) times daily as needed. For attention      . clonazePAM (KLONOPIN) 2 MG tablet Take 2 mg by mouth 3 (three) times daily as needed. For anxiety      . cyclobenzaprine (FLEXERIL) 10 MG tablet TAKE 1 TABLET (10 MG TOTAL) BY MOUTH 3 (THREE) TIMES DAILY AS NEEDED FOR MUSCLE SPASMS.  90 tablet  0  . meloxicam (MOBIC) 15 MG tablet Take 1 tablet (15 mg total) by mouth daily.  30 tablet  3  . ondansetron (ZOFRAN) 4 MG tablet Take 1 tablet (4 mg total) by mouth every 6 (six) hours as needed for nausea.  6 tablet  0  . QUEtiapine (SEROQUEL) 300 MG tablet Take 300 mg by mouth at bedtime.      . topiramate (TOPAMAX) 100 MG tablet Take 100 mg by mouth daily.       No current facility-administered medications on file prior to visit.   Allergies  Allergen Reactions  . Clindamycin/Lincomycin   . Sulfa Antibiotics Nausea And Vomiting and Rash   History   Social History  . Marital Status: Widowed    Spouse Name: N/A    Number of Children: N/A  . Years of Education: N/A   Occupational History  . Not on file.   Social History Main Topics  . Smoking status: Current Every Day Smoker  . Smokeless tobacco: Never Used  . Alcohol Use: No  . Drug Use: No  . Sexual Activity: Not on file   Other Topics Concern  . Not on file   Social History Narrative  . No narrative on file      Review of Systems  All other systems reviewed and are negative.       Objective:   Physical Exam  Vitals reviewed. Cardiovascular: Normal rate and regular rhythm.   Pulmonary/Chest: Effort normal and breath sounds normal.  Skin: There is erythema.   3 cm x 3 cm erythematous based bulla on the dorsum of the left wrist        Assessment & Plan:  1. Burn Second-degree burn to left wrist. I will treat with Silvadene cream applied daily under a nonadherent gauze to prevent infection. I anticipate healing within the next one to 2 weeks.  Recheck in 2 weeks if no better or sooner if worse. - silver sulfADIAZINE (SILVADENE) 1 % cream; Apply topically daily.  Dispense: 50 g; Refill: 0

## 2013-05-23 ENCOUNTER — Ambulatory Visit (INDEPENDENT_AMBULATORY_CARE_PROVIDER_SITE_OTHER): Payer: Medicaid Other | Admitting: Family Medicine

## 2013-05-23 ENCOUNTER — Encounter: Payer: Self-pay | Admitting: Family Medicine

## 2013-05-23 VITALS — BP 100/80 | HR 68 | Temp 98.1°F | Resp 18 | Wt 138.0 lb

## 2013-05-23 DIAGNOSIS — J029 Acute pharyngitis, unspecified: Secondary | ICD-10-CM

## 2013-05-23 DIAGNOSIS — Z111 Encounter for screening for respiratory tuberculosis: Secondary | ICD-10-CM

## 2013-05-23 DIAGNOSIS — Z09 Encounter for follow-up examination after completed treatment for conditions other than malignant neoplasm: Secondary | ICD-10-CM

## 2013-05-23 DIAGNOSIS — J00 Acute nasopharyngitis [common cold]: Secondary | ICD-10-CM | POA: Insufficient documentation

## 2013-05-23 DIAGNOSIS — Z23 Encounter for immunization: Secondary | ICD-10-CM

## 2013-05-23 LAB — RAPID STREP SCREEN (MED CTR MEBANE ONLY): Streptococcus, Group A Screen (Direct): NEGATIVE

## 2013-05-23 MED ORDER — GUAIFENESIN-CODEINE 100-10 MG/5ML PO SOLN: 5.0000 mL | Freq: Three times a day (TID) | ORAL | Status: DC | PRN

## 2013-05-23 NOTE — Patient Instructions (Signed)
Continue cough medication Plenty of fluids Zofran refilled  Flu shot PPD placed today return to have this read

## 2013-05-23 NOTE — Assessment & Plan Note (Signed)
Mild Common cold symptoms Cough medication to be refilled Push fluids No fever  Flu shot, PPD

## 2013-05-23 NOTE — Progress Notes (Signed)
  Subjective:    Patient ID: Janet [name], female    DOB: 12-Mar-1976, 37 y.o.   MRN: [id number]  HPI  Pt here for PPD placement. Complains of sore throat with dry cough for past 3 days, runny nose. Denies fever, sinus drainage, wheezing, SOB. No OTC meds but has been using a cough med with codiene at bedtime to help her rest. Wants this refilled. Daughter had similar symptoms.  Review of Systems - per above  GEN- denies fatigue, fever, weight loss,weakness, recent illness HEENT- denies eye drainage, change in vision, +nasal discharge, CVS- denies chest pain, palpitations RESP- denies SOB, +cough, wheeze ABD- denies N/V, change in stools, abd pain Neuro- denies headache, dizziness, syncope, seizure activity       Objective:   Physical Exam  GEN- NAD, alert and oriented x3 HEENT- PERRL, EOMI, non injected sclera, pink conjunctiva, MMM, oropharynx clear, TM clear bilat no effusion, no maxillary sinus tenderness, nares clear  Neck- Supple, no LAD CVS- RRR, no murmur RESP-CTAB Pulses- Radial 2+         Assessment & Plan:

## 2013-05-25 ENCOUNTER — Ambulatory Visit: Payer: Medicaid Other | Admitting: *Deleted

## 2013-05-25 DIAGNOSIS — Z111 Encounter for screening for respiratory tuberculosis: Secondary | ICD-10-CM

## 2013-05-25 LAB — TB SKIN TEST
Induration: 0 mm
TB Skin Test: NEGATIVE

## 2013-07-27 ENCOUNTER — Encounter: Payer: Self-pay | Admitting: Family Medicine

## 2013-07-27 ENCOUNTER — Ambulatory Visit (INDEPENDENT_AMBULATORY_CARE_PROVIDER_SITE_OTHER): Payer: Medicaid Other | Admitting: Family Medicine

## 2013-07-27 VITALS — BP 110/70 | HR 98 | Temp 97.4°F | Resp 16 | Ht 64.0 in | Wt 139.0 lb

## 2013-07-27 DIAGNOSIS — J019 Acute sinusitis, unspecified: Secondary | ICD-10-CM

## 2013-07-27 MED ORDER — AMOXICILLIN-POT CLAVULANATE 875-125 MG PO TABS: 1.0000 | ORAL_TABLET | Freq: Two times a day (BID) | ORAL | Status: DC

## 2013-07-27 MED ORDER — FLUTICASONE PROPIONATE 50 MCG/ACT NA SUSP: 2.0000 | Freq: Every day | NASAL | Status: DC

## 2013-07-27 NOTE — Progress Notes (Signed)
Subjective:    Patient ID: Janet [name], female    DOB: 06/21/1976, 37 y.o.   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 Ever since that time she has had significant pain and swelling in both maxillary sinuses, constant rhinorrhea, postnasal drip, headaches and sinus pressure. She denies any fever.   She has tried Zyrtec, Flonase, prednisone, and nasal saline for the last 2 weeks without benefit. She continues to have constant pain in both maxillary sinuses. She is here today for treatment. Past Medical History  Diagnosis Date  . Migraines   . Sinusitis    Current Outpatient Prescriptions on File Prior to Visit  Medication Sig Dispense Refill  . amphetamine-dextroamphetamine (ADDERALL) 20 MG tablet Take 5 mg by mouth 3 (three) times daily as needed. For attention      . cetirizine (ZYRTEC) 10 MG tablet Take 10 mg by mouth every other day.      . clonazePAM (KLONOPIN) 2 MG tablet Take 2 mg by mouth 3 (three) times daily as needed. For anxiety      . cyclobenzaprine (FLEXERIL) 10 MG tablet TAKE 1 TABLET (10 MG TOTAL) BY MOUTH 3 (THREE) TIMES DAILY AS NEEDED FOR MUSCLE SPASMS.  90 tablet  0  . ondansetron (ZOFRAN) 4 MG tablet Take 1 tablet (4 mg total) by mouth every 6 (six) hours as needed for nausea.  6 tablet  0  . QUEtiapine (SEROQUEL) 300 MG tablet Take 300 mg by mouth at bedtime.       No current facility-administered medications on file prior to visit.   Allergies  Allergen Reactions  . Clindamycin/Lincomycin   . Sulfa Antibiotics Nausea And Vomiting and Rash   History   Social History  . Marital Status: Widowed    Spouse Name: N/A    Number of Children: N/A  . Years of Education: N/A   Occupational History  . Not on file.   Social History Main Topics  . Smoking status: Current Every Day Smoker  . Smokeless tobacco: Never Used  . Alcohol Use: No  . Drug Use: No  . Sexual Activity: Not on file    Other Topics Concern  . Not on file   Social History Narrative  . No narrative on file     Review of Systems  All other systems reviewed and are negative.       Objective:   Physical Exam  Vitals reviewed. HENT:  Right Ear: Tympanic membrane, external ear and ear canal normal.  Left Ear: Tympanic membrane, external ear and ear canal normal.  Nose: Mucosal edema and rhinorrhea present. Right sinus exhibits maxillary sinus tenderness. Left sinus exhibits maxillary sinus tenderness.  Mouth/Throat: Oropharynx is clear and moist. No oropharyngeal exudate.  Neck: Neck supple.  Cardiovascular: Normal rate, regular rhythm and normal heart sounds.   Pulmonary/Chest: Effort normal and breath sounds normal.  Lymphadenopathy:    She has no cervical adenopathy.          Assessment & Plan:  1. Acute rhinosinusitis Continue Flonase and nasal saline daily. Use nasal saline 4 times a day. Start Augmentin 875 mg by mouth twice a day for 10 days. Recheck in 1 week if no better or sooner if worse. - amoxicillin-clavulanate (AUGMENTIN) 875-125 MG per tablet; Take 1 tablet by mouth 2 (two) times daily.  Dispense: 20 tablet; Refill: 0 - fluticasone (FLONASE) 50 MCG/ACT nasal spray; Place 2 sprays  into both nostrils daily.  Dispense: 16 g; Refill: 6

## 2013-08-17 ENCOUNTER — Other Ambulatory Visit: Payer: Self-pay | Admitting: Family Medicine

## 2013-08-17 NOTE — Telephone Encounter (Signed)
Medication refilled per protocol. 

## 2013-08-17 NOTE — Telephone Encounter (Signed)
?   OK to Refill  

## 2013-08-18 NOTE — Telephone Encounter (Signed)
ok 

## 2013-09-15 ENCOUNTER — Telehealth: Payer: Self-pay | Admitting: Family Medicine

## 2013-09-15 MED ORDER — CYCLOBENZAPRINE HCL 10 MG PO TABS: ORAL_TABLET | ORAL | Status: DC

## 2013-09-15 NOTE — Telephone Encounter (Signed)
Med refilled.

## 2013-09-15 NOTE — Telephone Encounter (Signed)
ok 

## 2013-09-15 NOTE — Telephone Encounter (Signed)
Requesting a refill on Flexeril..? OK to Refill  

## 2013-12-13 ENCOUNTER — Encounter: Payer: Self-pay | Admitting: Family Medicine

## 2013-12-13 ENCOUNTER — Ambulatory Visit (INDEPENDENT_AMBULATORY_CARE_PROVIDER_SITE_OTHER): Payer: Medicaid Other | Admitting: Family Medicine

## 2013-12-13 VITALS — BP 120/78 | HR 88 | Temp 98.1°F | Resp 16 | Ht 63.0 in | Wt 139.0 lb

## 2013-12-13 DIAGNOSIS — J029 Acute pharyngitis, unspecified: Secondary | ICD-10-CM

## 2013-12-13 DIAGNOSIS — R5381 Other malaise: Secondary | ICD-10-CM

## 2013-12-13 DIAGNOSIS — K120 Recurrent oral aphthae: Secondary | ICD-10-CM

## 2013-12-13 DIAGNOSIS — R5383 Other fatigue: Secondary | ICD-10-CM

## 2013-12-13 LAB — RAPID STREP SCREEN (MED CTR MEBANE ONLY): Streptococcus, Group A Screen (Direct): NEGATIVE

## 2013-12-13 MED ORDER — TRIAMCINOLONE ACETONIDE 0.1 % MT PSTE: 1.0000 "application " | PASTE | Freq: Two times a day (BID) | OROMUCOSAL | Status: DC

## 2013-12-13 MED ORDER — FLUCONAZOLE 150 MG PO TABS: 150.0000 mg | ORAL_TABLET | Freq: Once | ORAL | Status: DC

## 2013-12-13 MED ORDER — AMOXICILLIN-POT CLAVULANATE 875-125 MG PO TABS: 1.0000 | ORAL_TABLET | Freq: Two times a day (BID) | ORAL | Status: DC

## 2013-12-13 NOTE — Progress Notes (Signed)
Patient ID: Janet [name], female   DOB: 08/11/1975, 38 y.o.   MRN: [id number]   Subjective:    Patient ID: Janet [name], female    DOB: 08/11/1975, 38 y.o.   MRN: [id number]  Patient presents for Illness  Patient here with sore throat for the past 3 weeks. She had some leftover amoxicillin therefore she did take about 6 or 7 days of amoxicillin. With the sore throat she's had headache as well as some body aches fever she's not had any fever. 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 She also noticed a couple days ago that she has some blisters on the side of her tongue which is painful when she eats. She's not had any cough and sinus drainage. Her allergies are under good control with her current medications  States she's been fatigued more than usual the past couple months. She does work 6 days a week however she is worried about anemia and her thyroid as both her mother and father had thyroid abnormalities. She's not had any significant weight loss. She has not had any cold or heat intolerance     Review Of Systems:  GEN- + fatigue, fever, weight loss,weakness, recent illness HEENT- denies eye drainage, change in vision, nasal discharge, CVS- denies chest pain, palpitations RESP- denies SOB, cough, wheeze ABD- denies N/V, change in stools, abd pain Neuro- +headache, dizziness, syncope, seizure activity       Objective:    BP 120/78  Pulse 88  Temp(Src) 98.1 F (36.7 C) (Oral)  Resp 16  Ht 5\' 3"  (1.6 m)  Wt 139 lb (63.05 kg)  BMI 24.63 kg/m2 GEN- NAD, alert and oriented x3 HEENT- PERRL, EOMI, non injected sclera, pink conjunctiva, MMM, oropharynx +injection, mild tonsilar enlargement gray based ulcer right lateral tongue, erythema gray based ulcer of upper buccal mucosa of lip,TM clear bilat no effusion, no maxillary sinus tenderness, nares clear fundus benign Neck- Supple, + anterior LAD, no thyromegaly CVS- RRR, no murmur RESP-CTAB EXT- No edema Pulses- Radial  2+          Assessment & Plan:      Problem List Items Addressed This Visit   None    Visit Diagnoses   Acute pharyngitis    -  Primary    Her strep is negative however based on examination she does have strep pharyngitis I will have her take another course of Augmentin x10 days    Relevant Orders       Rapid Strep Screen (Completed)       CBC with Differential    Aphthous ulcer of mouth        Oral triamcinolone given apply to lesions    Other malaise and fatigue        I think this is multifactorial as she is a current illness however she is working 6 days a week. At request I will check her labs as she does have family history of thyroid disorder    Relevant Orders       CBC with Differential       Comprehensive metabolic panel       TSH       Note: This dictation was prepared with Dragon dictation along with smaller phrase technology. Any transcriptional errors that result from this process are unintentional.

## 2013-12-13 NOTE — Patient Instructions (Signed)
Take the antibiotics as prescribed Use salt water for throat Orabase for ulcer We will call with labs  F/U as needed

## 2013-12-14 LAB — CBC WITH DIFFERENTIAL/PLATELET
BASOS PCT: 0 % (ref 0–1)
Basophils Absolute: 0 10*3/uL (ref 0.0–0.1)
EOS ABS: 0.1 10*3/uL (ref 0.0–0.7)
EOS PCT: 1 % (ref 0–5)
HCT: 36.3 % (ref 36.0–46.0)
HEMOGLOBIN: 12.7 g/dL (ref 12.0–15.0)
Lymphocytes Relative: 25 % (ref 12–46)
Lymphs Abs: 1.7 10*3/uL (ref 0.7–4.0)
MCH: 30.8 pg (ref 26.0–34.0)
MCHC: 35 g/dL (ref 30.0–36.0)
MCV: 87.9 fL (ref 78.0–100.0)
MONO ABS: 0.3 10*3/uL (ref 0.1–1.0)
MONOS PCT: 4 % (ref 3–12)
NEUTROS PCT: 70 % (ref 43–77)
Neutro Abs: 4.6 10*3/uL (ref 1.7–7.7)
Platelets: 180 10*3/uL (ref 150–400)
RBC: 4.13 MIL/uL (ref 3.87–5.11)
RDW: 14 % (ref 11.5–15.5)
WBC: 6.6 10*3/uL (ref 4.0–10.5)

## 2013-12-14 LAB — COMPREHENSIVE METABOLIC PANEL
ALBUMIN: 4 g/dL (ref 3.5–5.2)
ALT: 9 U/L (ref 0–35)
AST: 12 U/L (ref 0–37)
Alkaline Phosphatase: 57 U/L (ref 39–117)
BUN: 10 mg/dL (ref 6–23)
CO2: 25 mEq/L (ref 19–32)
Calcium: 9.1 mg/dL (ref 8.4–10.5)
Chloride: 102 mEq/L (ref 96–112)
Creat: 0.7 mg/dL (ref 0.50–1.10)
GLUCOSE: 152 mg/dL — AB (ref 70–99)
POTASSIUM: 3.3 meq/L — AB (ref 3.5–5.3)
Sodium: 138 mEq/L (ref 135–145)
Total Bilirubin: 0.5 mg/dL (ref 0.2–1.2)
Total Protein: 6.3 g/dL (ref 6.0–8.3)

## 2013-12-14 LAB — TSH: TSH: 0.378 u[IU]/mL (ref 0.350–4.500)

## 2013-12-15 ENCOUNTER — Other Ambulatory Visit: Payer: Self-pay | Admitting: Family Medicine

## 2013-12-15 NOTE — Telephone Encounter (Signed)
Refill appropriate and filled per protocol. 

## 2013-12-21 ENCOUNTER — Telehealth: Payer: Self-pay | Admitting: *Deleted

## 2013-12-21 NOTE — Telephone Encounter (Signed)
Pt was here on May 6 was given amoxicillin pt is still c/o severe sorethroat, wants to know if she can get another round of antibiiotic but something stronger. Please advise

## 2013-12-21 NOTE — Telephone Encounter (Signed)
I will forward to Dr. Jeanice Limurham since she evaluated patient

## 2013-12-22 ENCOUNTER — Encounter: Payer: Self-pay | Admitting: Family Medicine

## 2013-12-22 ENCOUNTER — Ambulatory Visit (INDEPENDENT_AMBULATORY_CARE_PROVIDER_SITE_OTHER): Payer: Medicaid Other | Admitting: Family Medicine

## 2013-12-22 ENCOUNTER — Telehealth: Payer: Self-pay | Admitting: Family Medicine

## 2013-12-22 VITALS — BP 120/78 | HR 100 | Temp 97.8°F | Resp 14 | Ht 64.0 in | Wt 137.0 lb

## 2013-12-22 DIAGNOSIS — J029 Acute pharyngitis, unspecified: Secondary | ICD-10-CM

## 2013-12-22 LAB — COMPLETE METABOLIC PANEL WITH GFR
ALK PHOS: 63 U/L (ref 39–117)
ALT: 11 U/L (ref 0–35)
AST: 15 U/L (ref 0–37)
Albumin: 4.6 g/dL (ref 3.5–5.2)
BILIRUBIN TOTAL: 0.6 mg/dL (ref 0.2–1.2)
BUN: 16 mg/dL (ref 6–23)
CO2: 28 mEq/L (ref 19–32)
Calcium: 9.7 mg/dL (ref 8.4–10.5)
Chloride: 100 mEq/L (ref 96–112)
Creat: 0.74 mg/dL (ref 0.50–1.10)
GFR, Est African American: >89 mL/min
GFR, Est Non African American: >89 mL/min
Glucose, Bld: 95 mg/dL (ref 70–99)
Potassium: 4 mEq/L (ref 3.5–5.3)
SODIUM: 138 meq/L (ref 135–145)
TOTAL PROTEIN: 6.8 g/dL (ref 6.0–8.3)

## 2013-12-22 LAB — CBC WITH DIFFERENTIAL/PLATELET
BASOS PCT: 0 % (ref 0–1)
Basophils Absolute: 0 10*3/uL (ref 0.0–0.1)
Eosinophils Absolute: 0.1 10*3/uL (ref 0.0–0.7)
Eosinophils Relative: 1 % (ref 0–5)
HCT: 41.7 % (ref 36.0–46.0)
Hemoglobin: 14.5 g/dL (ref 12.0–15.0)
LYMPHS ABS: 2.2 10*3/uL (ref 0.7–4.0)
Lymphocytes Relative: 32 % (ref 12–46)
MCH: 30.2 pg (ref 26.0–34.0)
MCHC: 34.8 g/dL (ref 30.0–36.0)
MCV: 86.9 fL (ref 78.0–100.0)
Monocytes Absolute: 0.4 10*3/uL (ref 0.1–1.0)
Monocytes Relative: 6 % (ref 3–12)
Neutro Abs: 4.2 10*3/uL (ref 1.7–7.7)
Neutrophils Relative %: 61 % (ref 43–77)
PLATELETS: 199 10*3/uL (ref 150–400)
RBC: 4.8 MIL/uL (ref 3.87–5.11)
RDW: 13.3 % (ref 11.5–15.5)
WBC: 6.9 10*3/uL (ref 4.0–10.5)

## 2013-12-22 NOTE — Progress Notes (Signed)
Subjective:    Patient ID: Janet BaneJennifer [name], female    DOB: 1975/08/14, 38 y.o.   MRN: [id number]  HPI Patient presents with sore throat now for 2-1/2 weeks. She also reports daily fatigue and muscle pain. She states she is no better. She was seen on May 6. A strep test obtained at that time was negative. She was started on Augmentin and she completed a full 10 day course her symptoms not improved. On examination today she does have erythema in the posterior pharynx. There is no swelling. There is no exudate. She does not have any tender lymphadenopathy in the neck.  She denies any cough or rhinorrhea. She denies any nausea vomiting diarrhea or abdominal pain. Past Medical History  Diagnosis Date  . Migraines   . Sinusitis    Current Outpatient Prescriptions on File Prior to Visit  Medication Sig Dispense Refill  . amoxicillin-clavulanate (AUGMENTIN) 875-125 MG per tablet Take 1 tablet by mouth 2 (two) times daily.  20 tablet  0  . amphetamine-dextroamphetamine (ADDERALL) 20 MG tablet Take 5 mg by mouth 3 (three) times daily as needed. For attention      . cetirizine (ZYRTEC) 10 MG tablet Take 10 mg by mouth every other day.      . clonazePAM (KLONOPIN) 2 MG tablet Take 2 mg by mouth 3 (three) times daily as needed. For anxiety      . cyclobenzaprine (FLEXERIL) 10 MG tablet TAKE 1 TABLET BY MOUTH 3 TIMES A DAY AS NEEDED FOR MUSCLE SPASMS  90 tablet  2  . fluconazole (DIFLUCAN) 150 MG tablet Take 1 tablet (150 mg total) by mouth once.  1 tablet  1  . fluticasone (FLONASE) 50 MCG/ACT nasal spray Place 2 sprays into both nostrils daily.  16 g  6  . ondansetron (ZOFRAN) 4 MG tablet TAKE 1 TABLET BY MOUTH EVERY 6 HOURS AS NEEDED  6 tablet  0  . QUEtiapine (SEROQUEL) 300 MG tablet Take 300 mg by mouth at bedtime.      . topiramate (TOPAMAX) 100 MG tablet Take 100 mg by mouth daily.      Marland Kitchen. triamcinolone (KENALOG) 0.1 % paste Use as directed 1 application in the mouth or throat 2 (two) times  daily.  5 g  1   No current facility-administered medications on file prior to visit.   Allergies  Allergen Reactions  . Clindamycin/Lincomycin   . Sulfa Antibiotics Nausea And Vomiting and Rash   History   Social History  . Marital Status: Widowed    Spouse Name: N/A    Number of Children: N/A  . Years of Education: N/A   Occupational History  . Not on file.   Social History Main Topics  . Smoking status: Current Every Day Smoker  . Smokeless tobacco: Never Used  . Alcohol Use: No  . Drug Use: No  . Sexual Activity: Not on file   Other Topics Concern  . Not on file   Social History Narrative  . No narrative on file      Review of Systems  All other systems reviewed and are negative.      Objective:   Physical Exam  Vitals reviewed. Constitutional: She appears well-developed and well-nourished.  HENT:  Mouth/Throat: Posterior oropharyngeal erythema present. No oropharyngeal exudate.  Neck: Neck supple.  Cardiovascular: Normal rate, regular rhythm and normal heart sounds.  Exam reveals no gallop and no friction rub.   No murmur heard. Pulmonary/Chest: Effort normal and breath sounds  normal. No respiratory distress. She has no wheezes. She has no rales.  Abdominal: Soft. Bowel sounds are normal. She exhibits no distension. There is no tenderness. There is no rebound and no guarding.  Lymphadenopathy:    She has no cervical adenopathy.          Assessment & Plan:  1. Acute pharyngitis Going to obtain a throat culture along with CMV antibodies as well as a Monospot.  At the present time I doubt a bacterial cause of her sore throat. I am concerned she may have acquired a mono-like virus. - Mono (Epstein Barr Virus) - Cytomegalovirus antibody, IgM - Cytomegalovirus antibody, IgG - Culture, Group A Strep - CBC with Differential - COMPLETE METABOLIC PANEL WITH GFR

## 2013-12-22 NOTE — Addendum Note (Signed)
Addended by: WRAY, SwazilandJORDAN on: 12/22/2013 12:34 PM   Modules accepted: Orders

## 2013-12-22 NOTE — Telephone Encounter (Signed)
Gave her provider advise.  States still sick with fever and chills.  Opted for appt.

## 2013-12-22 NOTE — Telephone Encounter (Signed)
I do not recommend any further antibiotics without re-evaluation, she had a 10 days course You can call in Dukes magic mouthwash , swish and spit  1 teaspoon Four times a day as needed for pain- QS for 2 weeks She can take Ibuprofen OTC as well

## 2013-12-22 NOTE — Telephone Encounter (Signed)
Error

## 2013-12-23 LAB — CMV IGM: CMV IgM: <8 AU/mL (ref <30.00)

## 2013-12-23 LAB — CYTOMEGALOVIRUS ANTIBODY, IGG: Cytomegalovirus Ab-IgG: 5.9 U/mL — ABNORMAL HIGH (ref <0.60)

## 2013-12-24 LAB — CULTURE, GROUP A STREP: Organism ID, Bacteria: NORMAL

## 2014-03-07 ENCOUNTER — Telehealth: Payer: Self-pay | Admitting: Family Medicine

## 2014-03-07 DIAGNOSIS — J312 Chronic pharyngitis: Secondary | ICD-10-CM

## 2014-03-07 DIAGNOSIS — J328 Other chronic sinusitis: Secondary | ICD-10-CM

## 2014-03-07 NOTE — Telephone Encounter (Signed)
Patient would like referral to ent doc if possible please call her back her back [phone]

## 2014-03-07 NOTE — Telephone Encounter (Signed)
Called pt back and she stated that she is having sore throat again, sinusitis, and tonsils stay sore and wants referral to   ENT doctor to look at and evaluate. Dr. Andee PolesVaught was ENT at [facility].

## 2014-03-08 NOTE — Telephone Encounter (Signed)
Referral to ENT initaited

## 2014-03-08 NOTE — Telephone Encounter (Signed)
ok 

## 2014-03-28 ENCOUNTER — Ambulatory Visit: Payer: Self-pay | Admitting: Otolaryngology

## 2014-04-30 ENCOUNTER — Other Ambulatory Visit: Payer: Self-pay | Admitting: Family Medicine

## 2014-04-30 NOTE — Telephone Encounter (Signed)
Refill appropriate and filled per protocol. 

## 2014-08-15 ENCOUNTER — Ambulatory Visit: Payer: Medicaid Other

## 2014-08-15 ENCOUNTER — Encounter: Payer: Self-pay | Admitting: Family Medicine

## 2014-08-15 ENCOUNTER — Other Ambulatory Visit: Payer: Self-pay | Admitting: Family Medicine

## 2014-08-15 ENCOUNTER — Ambulatory Visit (INDEPENDENT_AMBULATORY_CARE_PROVIDER_SITE_OTHER): Payer: 59 | Admitting: Family Medicine

## 2014-08-15 VITALS — BP 118/72 | HR 88 | Temp 98.3°F | Resp 16 | Ht 64.0 in | Wt 125.0 lb

## 2014-08-15 DIAGNOSIS — N958 Other specified menopausal and perimenopausal disorders: Secondary | ICD-10-CM

## 2014-08-15 DIAGNOSIS — E8941 Symptomatic postprocedural ovarian failure: Secondary | ICD-10-CM

## 2014-08-15 DIAGNOSIS — Z111 Encounter for screening for respiratory tuberculosis: Secondary | ICD-10-CM

## 2014-08-15 DIAGNOSIS — N760 Acute vaginitis: Secondary | ICD-10-CM

## 2014-08-15 DIAGNOSIS — N3000 Acute cystitis without hematuria: Secondary | ICD-10-CM

## 2014-08-15 DIAGNOSIS — R300 Dysuria: Secondary | ICD-10-CM

## 2014-08-15 LAB — WET PREP FOR TRICH, YEAST, CLUE
TRICH WET PREP: NONE SEEN
Yeast Wet Prep HPF POC: NONE SEEN

## 2014-08-15 LAB — URINALYSIS, MICROSCOPIC ONLY
CRYSTALS: NONE SEEN
WBC, UA: >50 WBC/hpf — AB (ref <3)

## 2014-08-15 LAB — URINALYSIS, ROUTINE W REFLEX MICROSCOPIC
GLUCOSE, UA: 100 mg/dL — AB
Nitrite: POSITIVE — AB
Protein, ur: 100 mg/dL — AB
SPECIFIC GRAVITY, URINE: 1.01 (ref 1.005–1.030)
UROBILINOGEN UA: 4 mg/dL — AB (ref 0.0–1.0)
pH: 5 (ref 5.0–8.0)

## 2014-08-15 MED ORDER — CEPHALEXIN 500 MG PO CAPS: 500.0000 mg | ORAL_CAPSULE | Freq: Four times a day (QID) | ORAL | Status: DC

## 2014-08-15 NOTE — Patient Instructions (Signed)
We will call with lab results Start antibiotics, Keflex for urine infection F/U PPD placed, return on Friday -- NURSE VISIT

## 2014-08-15 NOTE — Progress Notes (Signed)
Patient ID: Janet [name], female   DOB: [birth year], 39 y.o.   MRN: [id number]   Subjective:    Patient ID: Janet AhoJennifer B [name], female    DOB: [birth year], 39 y.o.   MRN: [id number]  Patient presents for Fatigue; Hot Flashes; and UTI/Yeast Infection patient here with multiple concerns. She's had burning with urination as well as some vaginal discharge for the past week. She's been taking AZO over-the-counter with no improvement in symptoms she also did a Monistat three-day course. She had some mild fever the other day that has now resolved she is mostly suprapubic pressure and pain.  She is status post hysterectomy she also had one ovary removed because of recurrent polyps and cysts. This was back in 2005. She's been having increased hot flashes over the past few months states that she wakes up drenched she is not on any type of hormone therapy currently. She was uninsured therefore cannot follow up with her gynecologist before.  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    Review Of Systems:  GEN- denies fatigue, fever, weight loss,weakness, recent illness HEENT- denies eye drainage, change in vision, nasal discharge, CVS- denies chest pain, palpitations RESP- denies SOB, cough, wheeze ABD- denies N/V, change in stools, abd pain GU- + dysuria, hematuria, dribbling, incontinence MSK- denies joint pain, muscle aches, injury Neuro- denies headache, dizziness, syncope, seizure activity       Objective:    BP 118/72 mmHg  Pulse 88  Temp(Src) 98.3 F (36.8 C) (Oral)  Resp 16  Ht 5\' 4"  (1.626 m)  Wt 125 lb (56.7 kg)  BMI 21.45 kg/m2 GEN- NAD, alert and oriented x3 HEENT- PERRL, EOMI, non injected sclera, pink conjunctiva, MMM, oropharynx clear Neck- Supple, no thyromegaly CVS- RRR, no murmur RESP-CTAB ABD-NABS,soft,TTP suprapubic and lower quadrants, no rebound, no gauridng, no CVA tendernesss GU- normal external genitalia, vaginal mucosa pink and moist, s/p hysterecomty,  ovaries not palpable, white discharge in vault           Assessment & Plan:      Problem List Items Addressed This Visit    None    Visit Diagnoses    Dysuria    -  Primary    Relevant Orders       Urinalysis, Routine w reflex microscopic (Completed)    Hot flashes due to surgical menopause        May benefit from low dose estrogen, checking hormone levels first, she would need to quit smoking    Relevant Orders       CBC with Differential       Basic metabolic panel       TSH       FSH/LH    Vaginitis and vulvovaginitis        Culture taken, treat if positive    Relevant Orders       WET PREP FOR TRICH, YEAST, CLUE (Completed)       GC/Chlamydia Amp Probe, Genital    Screening-pulmonary TB        Relevant Orders       TB Skin Test (Completed)    Acute cystitis without hematuria        Start keflex QID, culture sent       Note: This dictation was prepared with Dragon dictation along with smaller phrase technology. Any transcriptional errors that result from this process are unintentional.

## 2014-08-16 LAB — FSH/LH
FSH: 95.2 m[IU]/mL
LH: 44 m[IU]/mL

## 2014-08-16 LAB — CBC WITH DIFFERENTIAL/PLATELET
Basophils Absolute: 0 10*3/uL (ref 0.0–0.1)
Basophils Relative: 0 % (ref 0–1)
Eosinophils Absolute: 0.2 10*3/uL (ref 0.0–0.7)
Eosinophils Relative: 2 % (ref 0–5)
HEMATOCRIT: 41.8 % (ref 36.0–46.0)
HEMOGLOBIN: 14.4 g/dL (ref 12.0–15.0)
LYMPHS PCT: 31 % (ref 12–46)
Lymphs Abs: 2.5 10*3/uL (ref 0.7–4.0)
MCH: 30.6 pg (ref 26.0–34.0)
MCHC: 34.4 g/dL (ref 30.0–36.0)
MCV: 88.7 fL (ref 78.0–100.0)
MPV: 11.3 fL (ref 8.6–12.4)
Monocytes Absolute: 0.7 10*3/uL (ref 0.1–1.0)
Monocytes Relative: 8 % (ref 3–12)
NEUTROS PCT: 59 % (ref 43–77)
Neutro Abs: 4.8 10*3/uL (ref 1.7–7.7)
PLATELETS: 220 10*3/uL (ref 150–400)
RBC: 4.71 MIL/uL (ref 3.87–5.11)
RDW: 13 % (ref 11.5–15.5)
WBC: 8.2 10*3/uL (ref 4.0–10.5)

## 2014-08-16 LAB — BASIC METABOLIC PANEL
BUN: 14 mg/dL (ref 6–23)
CO2: 29 mEq/L (ref 19–32)
CREATININE: 0.71 mg/dL (ref 0.50–1.10)
Calcium: 9.6 mg/dL (ref 8.4–10.5)
Chloride: 101 mEq/L (ref 96–112)
Glucose, Bld: 82 mg/dL (ref 70–99)
Potassium: 4.1 mEq/L (ref 3.5–5.3)
Sodium: 140 mEq/L (ref 135–145)

## 2014-08-16 LAB — TSH: TSH: 0.529 u[IU]/mL (ref 0.350–4.500)

## 2014-08-17 ENCOUNTER — Ambulatory Visit (INDEPENDENT_AMBULATORY_CARE_PROVIDER_SITE_OTHER): Payer: 59 | Admitting: Family Medicine

## 2014-08-17 DIAGNOSIS — Z111 Encounter for screening for respiratory tuberculosis: Secondary | ICD-10-CM

## 2014-08-17 LAB — TB SKIN TEST: TB Skin Test: POSITIVE

## 2014-08-17 NOTE — Progress Notes (Signed)
Patient ID: Janet AhoJennifer B [name], female   DOB: [birth year], 39 y.o.   MRN: [id number] Pt  Here for reading of TB skin test applied on Wednesday.  Pt has a POSITVE result.  Pt states has had positive result last year and Gold standard lab was done and was negative.  Can not find that result in file.

## 2014-08-17 NOTE — Patient Instructions (Signed)
Go to [facility] to get Chest Xray  We will call with lab results No further PPD to be placed , you will need blood test done

## 2014-08-17 NOTE — Progress Notes (Signed)
Patient ID: Janet [name], female   DOB: October 02, 1975, 39 y.o.   MRN: [id number]   Subjective:    Patient ID: Janet [name], female    DOB: October 02, 1975, 39 y.o.   MRN: [id number]  Patient presents for TB skin test read  Pt here for TB test, she failed to tell us she had positive TB last year and had interferon gold done which was negative. Works as Set designerhome health Hospice nurse.   No cough, she has hot flashes but surgically menopaused and hormones labs recently done support this.      - Will get CXR and Interferon gold, out of work for next 72 hours until test results come back  [phone] [place] [employer]    [place], [facility]  Review Of Systems:  GEN- denies fatigue, fever, weight loss,weakness, recent illness HEENT- denies eye drainage, change in vision, nasal discharge, CVS- denies chest pain, palpitations RESP- denies SOB, cough, wheeze Neuro- denies headache, dizziness, syncope, seizure activity       Objective:    There were no vitals taken for this visit. GEN- NAD, alert and oriented x3 Skin- positive PPD 2cm diameter        Assessment & Plan:      Problem List Items Addressed This Visit    None    Visit Diagnoses    Screening-pulmonary TB    -  Primary    Relevant Orders       Quantiferon tb gold assay       DG Chest 2 View       Note: This dictation was prepared with Dragon dictation along with smaller phrase technology. Any transcriptional errors that result from this process are unintentional.

## 2014-08-20 ENCOUNTER — Ambulatory Visit
Admission: RE | Admit: 2014-08-20 | Discharge: 2014-08-20 | Disposition: A | Discharge status: Home / Self Care | Payer: 59 | Source: Ambulatory Visit | Attending: Family Medicine | Admitting: Family Medicine

## 2014-08-20 ENCOUNTER — Encounter: Payer: Self-pay | Admitting: *Deleted

## 2014-08-20 DIAGNOSIS — Z111 Encounter for screening for respiratory tuberculosis: Secondary | ICD-10-CM

## 2014-08-20 LAB — QUANTIFERON TB GOLD ASSAY (BLOOD)
Interferon Gamma Release Assay: NEGATIVE
MITOGEN VALUE: 8.8 [IU]/mL
Quantiferon Nil Value: 0.12 IU/mL
Quantiferon Tb Ag Minus Nil Value: 0 IU/mL
TB Ag value: 0.11 IU/mL

## 2014-08-21 ENCOUNTER — Telehealth: Payer: Self-pay | Admitting: *Deleted

## 2014-08-21 LAB — GC/CHLAMYDIA PROBE AMP

## 2014-08-21 MED ORDER — ESTROGENS CONJUGATED 0.3 MG PO TABS: 0.3000 mg | ORAL_TABLET | Freq: Every day | ORAL | Status: DC

## 2014-08-21 NOTE — Telephone Encounter (Signed)
Patient contacted to make aware of results.   Reports that MD was looking into Premarin for her.   Please advise.

## 2014-08-21 NOTE — Telephone Encounter (Signed)
Premarin sent to pharmacy , low dose 0.3mg  once a day for hot flashes

## 2014-08-21 NOTE — Telephone Encounter (Signed)
Patient returned call and made aware.

## 2014-08-21 NOTE — Telephone Encounter (Signed)
Call placed to patient. LMTRC.  

## 2014-08-22 LAB — C. TRACHOMATIS/N. GONORRHOEAE RNA
C. trachomatis RNA, TMA: NOT DETECTED
N. gonorrhoeae RNA, TMA: DETECTED — AB

## 2014-08-24 ENCOUNTER — Ambulatory Visit (INDEPENDENT_AMBULATORY_CARE_PROVIDER_SITE_OTHER): Payer: 59 | Admitting: *Deleted

## 2014-08-24 ENCOUNTER — Telehealth: Payer: Self-pay | Admitting: *Deleted

## 2014-08-24 ENCOUNTER — Other Ambulatory Visit: Payer: Self-pay | Admitting: *Deleted

## 2014-08-24 DIAGNOSIS — A64 Unspecified sexually transmitted disease: Secondary | ICD-10-CM

## 2014-08-24 MED ORDER — CEFTRIAXONE SODIUM 1 G IJ SOLR
250.0000 mg | Freq: Once | INTRAMUSCULAR | Status: AC
  Administered 2014-08-24: 250 mg via INTRAMUSCULAR

## 2014-08-24 MED ORDER — ESTRADIOL 1 MG PO TABS: ORAL_TABLET | ORAL | Status: DC

## 2014-08-24 MED ORDER — AZITHROMYCIN 500 MG PO TABS: 1000.0000 mg | ORAL_TABLET | Freq: Once | ORAL | Status: DC

## 2014-08-24 NOTE — Progress Notes (Signed)
Patient ID: Janet [name], female   DOB: [birth year], 39 y.o.   MRN: [id number] Patient seen in office for injection due to infection.   Tolerated IM administration well.

## 2014-08-24 NOTE — Telephone Encounter (Signed)
Received call from patient.   Reports that Premarin is too expensive. States that she was advised by pharmacist that Estrace is comparable, but would be more affordable.  MD please advise.

## 2014-08-24 NOTE — Telephone Encounter (Signed)
Send in Estrace 1mg , take daily for 3 weeks, off 1 week

## 2014-08-24 NOTE — Telephone Encounter (Signed)
Prescription sent to pharmacy. .   Call placed to patient and patient made aware.  

## 2014-08-28 ENCOUNTER — Telehealth: Payer: Self-pay | Admitting: *Deleted

## 2014-08-28 NOTE — Telephone Encounter (Signed)
Call placed to patient and patient made aware.  

## 2014-08-28 NOTE — Telephone Encounter (Signed)
Hold estrace and recheck here next week.

## 2014-08-28 NOTE — Telephone Encounter (Signed)
Received call from patient.   Reports that she has been taking Estrace 1mg  PO Q HS x4 days. States that after taking medication she notes faint headache Q night.   Reports that she took medication last night and noted that headache was worse. Reports that she got really hot, worse than with her hot flashes, and then she became nauseous.    Requested to have MD advise.

## 2014-09-10 ENCOUNTER — Ambulatory Visit (INDEPENDENT_AMBULATORY_CARE_PROVIDER_SITE_OTHER): Payer: 59 | Admitting: Family Medicine

## 2014-09-10 ENCOUNTER — Encounter: Payer: Self-pay | Admitting: Family Medicine

## 2014-09-10 VITALS — BP 122/68 | HR 64 | Temp 98.4°F | Resp 16 | Ht 64.0 in | Wt 123.0 lb

## 2014-09-10 DIAGNOSIS — N951 Menopausal and female climacteric states: Secondary | ICD-10-CM

## 2014-09-10 DIAGNOSIS — R232 Flushing: Secondary | ICD-10-CM | POA: Insufficient documentation

## 2014-09-10 DIAGNOSIS — Z780 Asymptomatic menopausal state: Secondary | ICD-10-CM

## 2014-09-10 MED ORDER — ESTRADIOL 1 MG PO TABS: ORAL_TABLET | ORAL | Status: DC

## 2014-09-10 NOTE — Progress Notes (Signed)
Patient ID: Janet [name], female   DOB: 1976/04/30, 39 y.o.   MRN: [id number]   Subjective:    Patient ID: Janet [name], female    DOB: 1976/04/30, 39 y.o.   MRN: [id number]  Patient presents for Hormone therapy  patient to follow-up hormone therapy. She was started on estradiol 1 mg however she began having headaches with this. There is a caveat to this because during the same week she found out that her significant other had cheated on her and that's how she resulted in the STD that we recently treated and she was also on antibiotics at that time. She is menopausal based on hormone she is status post hysterectomy as well as right nephrectomy because of recurrent cyst she has a very low functioning left ovary which she was told years ago that she would need hormone therapy because of menopausal symptoms. She has hot flashes which are severe throughout the day.    Review Of Systems:  GEN- denies fatigue, fever, weight loss,weakness, recent illness HEENT- denies eye drainage, change in vision, nasal discharge, CVS- denies chest pain, palpitations RESP- denies SOB, cough, wheeze ABD- denies N/V, change in stools, abd pain GU- denies dysuria, hematuria, dribbling, incontinence MSK- denies joint pain, muscle aches, injury Neuro- denies headache, dizziness, syncope, seizure activity       Objective:    BP 122/68 mmHg  Pulse 64  Temp(Src) 98.4 F (36.9 C) (Oral)  Resp 16  Ht 5\' 4"  (1.626 m)  Wt 123 lb (55.792 kg)  BMI 21.10 kg/m2 GEN- NAD, alert and oriented x3 HEENT-PERRL,. EOMI, non icteric, pink conjunctiva, fundus benign, MMM, oropharynx clear CVS- RRR, no murmur RESP-CTAB EXT- No edema Pulses- Radial 2+ NEURO-CNII-XII in tact no deficits       Assessment & Plan:      Problem List Items Addressed This Visit      Unprioritized   Post menopausal syndrome - Primary   Hot flashes      Note: This dictation was prepared with Dragon dictation along with  smaller phrase technology. Any transcriptional errors that result from this process are unintentional.

## 2014-09-10 NOTE — Assessment & Plan Note (Addendum)
The headaches may have been MTF with the stress and everything going on at home Will try her on 0.5mg  once a day and see if she is able to tolerate this If not will try a different formulary BP looks good, exam normal

## 2014-09-10 NOTE — Patient Instructions (Signed)
Try taking 1/2 tablet of estrace once a day for 3 weeks, then off  F/U as needed

## 2014-10-11 ENCOUNTER — Telehealth: Payer: Self-pay | Admitting: Family Medicine

## 2014-10-11 NOTE — Telephone Encounter (Signed)
Patient is saying that estradiol is giving her really bad headaches, would like a call back about this, she has stopped taking it   [phone]

## 2014-10-11 NOTE — Telephone Encounter (Signed)
Pt states that she has had the HA since her lov and it has never gotten any better so she stopped the Estradiol and her hot flashes and sweats are awful now and was wanting to know if she could try the Pristiq again as she could not afford it before but she has meet her deductible and thinks her insurance will cover it now if we could send in a new RX. (OK to LMOVM as it is her personal cell and she is the only one with access)

## 2014-10-12 MED ORDER — ESTROGENS CONJUGATED 0.3 MG PO TABS: 0.3000 mg | ORAL_TABLET | Freq: Every day | ORAL | Status: DC

## 2014-10-12 NOTE — Telephone Encounter (Signed)
estrogens, conjugated, (PREMARIN) 0.3 MG tablet 30 tablet 3 08/21/2014 08/24/2014    Sig - Route:  Take 1 tablet (0.3 mg total) by mouth daily. Take daily for 21 days then do not take for 7 days. - Oral    Send in above premarin dose

## 2014-10-12 NOTE — Telephone Encounter (Signed)
Please call pt back and clarify I never had her on Pristiq which is anti-depressant, I had her on Premarin and that cost was high, so she was switched to Estradiol for her hot flashes

## 2014-10-12 NOTE — Telephone Encounter (Signed)
Pt thought it was Pristiq but I explained to her it was an anti-depressant and it would not help with her hot flashes. She said she would like to try the Premarin.

## 2014-10-12 NOTE — Telephone Encounter (Signed)
Med sent to pharm and pt aware 

## 2014-11-30 ENCOUNTER — Encounter: Payer: Self-pay | Admitting: Family Medicine

## 2014-11-30 ENCOUNTER — Ambulatory Visit (INDEPENDENT_AMBULATORY_CARE_PROVIDER_SITE_OTHER): Payer: 59 | Admitting: Family Medicine

## 2014-11-30 VITALS — BP 118/62 | HR 78 | Temp 98.5°F | Resp 14 | Ht 64.0 in | Wt 120.0 lb

## 2014-11-30 DIAGNOSIS — R300 Dysuria: Secondary | ICD-10-CM

## 2014-11-30 DIAGNOSIS — R1012 Left upper quadrant pain: Secondary | ICD-10-CM | POA: Diagnosis not present

## 2014-11-30 DIAGNOSIS — R109 Unspecified abdominal pain: Secondary | ICD-10-CM

## 2014-11-30 LAB — URINALYSIS, ROUTINE W REFLEX MICROSCOPIC
Bilirubin Urine: NEGATIVE
GLUCOSE, UA: NEGATIVE mg/dL
HGB URINE DIPSTICK: NEGATIVE
KETONES UR: NEGATIVE mg/dL
LEUKOCYTES UA: NEGATIVE
Nitrite: NEGATIVE
PH: 6 (ref 5.0–8.0)
Protein, ur: 30 mg/dL — AB
Urobilinogen, UA: 0.2 mg/dL (ref 0.0–1.0)

## 2014-11-30 LAB — URINALYSIS, MICROSCOPIC ONLY
CASTS: NONE SEEN
Crystals: NONE SEEN

## 2014-11-30 MED ORDER — HYDROCODONE-ACETAMINOPHEN 5-325 MG PO TABS: 1.0000 | ORAL_TABLET | Freq: Four times a day (QID) | ORAL | Status: DC | PRN

## 2014-11-30 NOTE — Patient Instructions (Signed)
Take the pain medication as prescribed Try heat to area Get the xray to look for evidence of kidney stone F/U as needed

## 2014-12-02 ENCOUNTER — Encounter: Payer: Self-pay | Admitting: Family Medicine

## 2014-12-02 NOTE — H&P (Signed)
PATIENT NAME:  Janet [name], Janet [name] MR#:  [id number] DATE OF BIRTH:  10-Feb-1976  DATE OF ADMISSION:  02/04/2012  PRIMARY CARE PHYSICIAN: Dr. Tanya Nones in [facility]   CHIEF COMPLAINT: Abdominal pain, nausea, vomiting, diarrhea.   HISTORY OF PRESENT ILLNESS: The patient is a 39 year old female who is on Augmentin,  status post septoplasty for four days, who presents with nausea, vomiting, diarrhea. The patient had a CT scan which showed pancolitis. Her C. difficile is positive.   REVIEW OF SYSTEMS: CONSTITUTIONAL: Positive fever, no fatigue, weakness. EYES: No blurred or double vision, glaucoma, or cataracts. ENT: No ear pain, hearing loss or seasonal allergies. RESPIRATORY: No cough, wheezing, hemoptysis, or chronic obstructive pulmonary disease. CARDIOVASCULAR: No chest pain, orthopnea, edema, arrhythmia, dyspnea on exertion, palpitations, or syncope. GI: Positive nausea, vomiting, and diarrhea. Positive generalized abdominal pain. No melena, ulcers, hematochezia. GU: No dysuria or hematuria. ENDOCRINE: No polyuria or polydipsia. HEMATOLOGIC/LYMPHATIC: No anemia or easy bruising. SKIN: No rash or lesions. MUSCULOSKELETAL: No limited activity, arthritis. NEUROLOGICAL:   No history of costovertebral angle tenderness or seizures. PSYCHIATRIC: Positive anxiety, depression.   PAST MEDICAL HISTORY: Depression and anxiety.   MEDICATIONS:  1. Zoloft 50 mg daily.  2. Clonazepam 1 mg p.o. t.i.d.  3. Cetirizine 10 mg daily.    ALLERGIES: Macrobid, clindamycin, sulfa.   SOCIAL HISTORY: The patient is a former smoker. No alcohol or IV drug use.   PAST SURGICAL HISTORY:  1. Kidney stones.  2. Ovarian cyst.  3. Partial hysterectomy.  4. Nasal septoplasty.   FAMILY HISTORY: Positive for hypertension.   PHYSICAL EXAMINATION:  VITAL SIGNS: The patient is afebrile 98.1, pulse is 84, respirations 18, blood pressure 90/55, 99% on room air.   GENERAL: The patient is alert, oriented, not in  acute distress.   HEENT: Head is atraumatic. Pupils are round, reactive. Anicteric sclerae. Tympanic membranes are clear. Mucous membranes are moist.  Oropharynx is clear.    NECK: Supple without jugular venous distention, carotid bruits or enlarged thyroid.   CARDIOVASCULAR: Regular rate and rhythm. No murmurs, gallops, or rubs. PMI is nondisplaced.   LUNGS: Clear to auscultation bilaterally without crackles, rales, rhonchi, or wheezing. Normal to percussion.   BACK: No CVA or vertebral tenderness.   ABDOMEN: Generalized tenderness without rebound or guarding.   EXTREMITIES: No clubbing, cyanosis, or edema.   NEUROLOGIC: Cranial nerves II through XII are intact. There are no focal deficits.   SKIN: Without rashes or lesions.   LABORATORY, DIAGNOSTIC AND RADIOLOGICAL DATA:  Sodium 132, potassium 2.8, chloride 98, bicarbonate 28, BUN 15, creatinine 0.86, glucose 115, total protein 6.7, albumin 2.7, bilirubin 0.5, alkaline phosphatase 116, AST 12, ALT 17. White blood cells 27.2, hemoglobin 13.7, hematocrit 39.1, platelets are 206.  INR 1.0.  C. difficile is positive.  Urinalysis shows no leukocyte esterase or nitrites.  CT scan shows pancolitis.   ASSESSMENT/PLAN: A 39 year old female who was on Augmentin from June 19th, just finished yesterday. She was on this for a septoplasty, presents with C. difficile colitis.   1. Clostridium difficile colitis: The patient has some nausea, so we will go ahead and treat her with IV Flagyl. Ideally we like this as p.o., and she will have Clostridium difficile precautions  . 2. Hyponatremia from nausea and vomiting: We will provide some IV fluids and repeat a BMP in the a.m.  3. Hypokalemia from nausea, vomiting, and diarrhea: We will replete, check magnesium and recheck in the a.m.  4. Hypotension, likely from nausea and  vomiting with dehydration: We will provide some IV fluids and monitor blood pressure.  5. Leukocytosis: Secondary to problem #1.  We will follow.  6. Depression and anxiety: We will continue outpatient medications.   TIME SPENT: Approximately 45 minutes.   ____________________________ Janyth ContesSital P. Juliene PinaMody, MD spm:cbb D: 02/04/2012 15:34:36 ET T: 02/04/2012 16:28:34 ET JOB#: [id number]  cc: Abelardo Seidner P. Juliene PinaMody, MD, <Dictator> [facility], Dr. Jimmye NormanPickard Candyce Gambino P Tiger Spieker MD ELECTRONICALLY SIGNED 02/04/2012 18:07

## 2014-12-02 NOTE — Discharge Summary (Signed)
PATIENT NAME:  Janet [name], Janet [name] MR#:  [id number] DATE OF BIRTH:  March 02, 1976  DATE OF ADMISSION:  02/04/2012 DATE OF DISCHARGE:  02/08/2012  PRESENTING COMPLAINT: Diarrhea.   DISCHARGE DIAGNOSES:  1. Clostridium difficile colitis post antibiotic use.  2. Leukocytosis, resolved.  3. Hyperkalemia due to diarrhea.   CONDITION ON DISCHARGE: Fair.   MEDICATIONS:  1. Flagyl 500 mg p.o. t.i.d. for six more days.  2. Zofran ODT 4 mg q.6 p.r.n.  3. Flora-Q 1 tablet b.i.d. for six days.  4. Cetirizine 10 mg at bedtime.  5. Sertraline 50 mg at bedtime.  6. Clonazepam 1 mg 3 times a day.   DIET: Regular with yogurt.   FOLLOW UP: Follow up with your primary care physician in 1 to 2 weeks.   LABORATORY, DIAGNOSTIC AND RADIOLOGICAL DATA: Labs at discharge: White count 10.1, potassium 3.0. Stool is positive for Clostridium difficile.   BRIEF SUMMARY OF HOSPITAL COURSE: Ms. Lawernce IonCranford is a 39 year old Caucasian female who recently had rhinoplasty/septoplasty, was on p.o. Augmentin, came to the hospital with:  1. Abdominal pain, diarrhea. She was admitted with acute Clostridium difficile colitis. CT showed pancolitis. Patient was recently on Augmentin after her surgery. She was started on p.o. Flagyl with some p.r.n. Zofran along with Florastor. She had increasing amount of stool. Initially, however, it slowed down. She was advanced to soft diet, was recommended to continue taking yogurt with her diet. Patient felt relatively much better prior to discharge.  2. Leukocytosis due to Clostridium difficile colitis. White count was down from 23,000 to 10,000.  3. Hypokalemia, hypomagnesemia due to diarrhea, was replaced orally.  4. Hospital stay otherwise remained stable. CODE STATUS: Patient remained a FULL CODE.   TIME SPENT: 40 minutes.  ____________________________ Wylie HailSona A. Allena KatzPatel, MD sap:cms D: 02/08/2012 14:06:39 ET T: 02/09/2012 11:01:02 ET JOB#: [id number]  cc: Keiland Pickering A. Allena KatzPatel, MD, <Dictator> Willow OraSONA  A Kashaun Bebo MD ELECTRONICALLY SIGNED 02/11/2012 14:10

## 2014-12-02 NOTE — Progress Notes (Signed)
Patient ID: Janet [name], female   DOB: Feb 18, 1976, 39 y.o.   MRN: [id number]   Subjective:    Patient ID: Janet [name], female    DOB: Feb 18, 1976, 39 y.o.   MRN: [id number]  Patient presents for Lower Back Pain  Pt here with left flank pain that radiates toward abdomen, history of kidney stones in the past, she has had multiple. Pain has been present for past week. No Injury to back, has taken ibuprofen and tylenol with no relief. Denies any dysuria, no chagne in bowels, no vaginal discharge, no fever, no N/V    Review Of Systems:  GEN- denies fatigue, fever, weight loss,weakness, recent illness HEENT- denies eye drainage, change in vision, nasal discharge, CVS- denies chest pain, palpitations RESP- denies SOB, cough, wheeze ABD- denies N/V, change in stools, abd pain GU- denies dysuria, hematuria, dribbling, incontinence MSK- + joint pain, muscle aches, injury Neuro- denies headache, dizziness, syncope, seizure activity       Objective:    BP 118/62 mmHg  Pulse 78  Temp(Src) 98.5 F (36.9 C) (Oral)  Resp 14  Ht 5\' 4"  (1.626 m)  Wt 120 lb (54.432 kg)  BMI 20.59 kg/m2 GEN- NAD, alert and oriented x3 CVS- RRR, no murmur RESP-CTAB ABD-NABS,soft,NT,ND MSK- TTP left flank region and side wall, neg SLR, good ROM spine EXT- No edema Pulses- Radial  2+        Assessment & Plan:      Problem List Items Addressed This Visit    None    Visit Diagnoses    Dysuria    -  Primary    Relevant Orders    Urinalysis, Routine w reflex microscopic (Completed)    Acute left flank pain        Concern for possible stone, UA unremarkable, given norco, obtain KUB to look for stones, will add flomax if present, push fluids, otherwise MSK pain    Relevant Orders    DG Abd 1 View       Note: This dictation was prepared with Dragon dictation along with smaller phrase technology. Any transcriptional errors that result from this process are unintentional.

## 2014-12-05 ENCOUNTER — Telehealth: Payer: Self-pay | Admitting: Family Medicine

## 2014-12-05 MED ORDER — ONDANSETRON HCL 4 MG PO TABS: 4.0000 mg | ORAL_TABLET | Freq: Three times a day (TID) | ORAL | Status: DC | PRN

## 2014-12-05 MED ORDER — HYDROCODONE-ACETAMINOPHEN 5-325 MG PO TABS: 1.0000 | ORAL_TABLET | Freq: Four times a day (QID) | ORAL | Status: DC | PRN

## 2014-12-05 NOTE — Telephone Encounter (Signed)
RX printed, left up front and patient aware to pick up and zofran sent to pharm

## 2014-12-05 NOTE — Telephone Encounter (Signed)
Patient says with her work schedule she has not had time to go and get her xray for possible kidney stones, when like to know if she can get refill on her norco and some zofran if possible, she says she will be going for xray tomorrow if possible  [phone]

## 2014-12-05 NOTE — Telephone Encounter (Signed)
Okay to refill , okay to send in zofran 20mg 

## 2014-12-31 ENCOUNTER — Encounter: Payer: Self-pay | Admitting: *Deleted

## 2014-12-31 ENCOUNTER — Encounter: Payer: Self-pay | Admitting: Family Medicine

## 2015-01-17 ENCOUNTER — Telehealth: Payer: Self-pay | Admitting: Family Medicine

## 2015-01-17 NOTE — Telephone Encounter (Signed)
Patient requesting copy of TB test results be faxed to [phone]

## 2015-01-18 ENCOUNTER — Encounter: Payer: Self-pay | Admitting: Family Medicine

## 2015-01-18 NOTE — Telephone Encounter (Signed)
TB Skin test results has been faxed to the number given as requested by pt

## 2015-02-13 ENCOUNTER — Encounter: Payer: Self-pay | Admitting: Family Medicine

## 2015-02-28 ENCOUNTER — Other Ambulatory Visit: Payer: Self-pay | Admitting: Family Medicine

## 2015-03-29 ENCOUNTER — Ambulatory Visit (INDEPENDENT_AMBULATORY_CARE_PROVIDER_SITE_OTHER): Payer: 59 | Admitting: Family Medicine

## 2015-03-29 ENCOUNTER — Encounter: Payer: Self-pay | Admitting: Family Medicine

## 2015-03-29 VITALS — BP 129/70 | HR 82 | Temp 98.1°F | Resp 14 | Ht 64.0 in | Wt 110.0 lb

## 2015-03-29 DIAGNOSIS — S76112A Strain of left quadriceps muscle, fascia and tendon, initial encounter: Secondary | ICD-10-CM | POA: Diagnosis not present

## 2015-03-29 DIAGNOSIS — R634 Abnormal weight loss: Secondary | ICD-10-CM

## 2015-03-29 DIAGNOSIS — Z720 Tobacco use: Secondary | ICD-10-CM | POA: Diagnosis not present

## 2015-03-29 DIAGNOSIS — R251 Tremor, unspecified: Secondary | ICD-10-CM

## 2015-03-29 DIAGNOSIS — F411 Generalized anxiety disorder: Secondary | ICD-10-CM | POA: Diagnosis not present

## 2015-03-29 DIAGNOSIS — R233 Spontaneous ecchymoses: Secondary | ICD-10-CM

## 2015-03-29 DIAGNOSIS — Z8669 Personal history of other diseases of the nervous system and sense organs: Secondary | ICD-10-CM

## 2015-03-29 DIAGNOSIS — R238 Other skin changes: Secondary | ICD-10-CM | POA: Diagnosis not present

## 2015-03-29 DIAGNOSIS — Z1321 Encounter for screening for nutritional disorder: Secondary | ICD-10-CM

## 2015-03-29 DIAGNOSIS — F17200 Nicotine dependence, unspecified, uncomplicated: Secondary | ICD-10-CM

## 2015-03-29 DIAGNOSIS — Z87898 Personal history of other specified conditions: Secondary | ICD-10-CM

## 2015-03-29 LAB — COMPREHENSIVE METABOLIC PANEL
ALBUMIN: 4.5 g/dL (ref 3.6–5.1)
ALT: 12 U/L (ref 6–29)
AST: 14 U/L (ref 10–30)
Alkaline Phosphatase: 54 U/L (ref 33–115)
BILIRUBIN TOTAL: 0.7 mg/dL (ref 0.2–1.2)
BUN: 14 mg/dL (ref 7–25)
CALCIUM: 9.3 mg/dL (ref 8.6–10.2)
CO2: 26 mmol/L (ref 20–31)
Chloride: 101 mmol/L (ref 98–110)
Creat: 0.84 mg/dL (ref 0.50–1.10)
GLUCOSE: 110 mg/dL — AB (ref 70–99)
POTASSIUM: 3.8 mmol/L (ref 3.5–5.3)
Sodium: 138 mmol/L (ref 135–146)
Total Protein: 6.8 g/dL (ref 6.1–8.1)

## 2015-03-29 LAB — CBC WITH DIFFERENTIAL/PLATELET
Basophils Absolute: 0 10*3/uL (ref 0.0–0.1)
Basophils Relative: 0 % (ref 0–1)
Eosinophils Absolute: 0.1 10*3/uL (ref 0.0–0.7)
Eosinophils Relative: 1 % (ref 0–5)
HEMATOCRIT: 39.2 % (ref 36.0–46.0)
HEMOGLOBIN: 13.5 g/dL (ref 12.0–15.0)
LYMPHS ABS: 1.9 10*3/uL (ref 0.7–4.0)
LYMPHS PCT: 28 % (ref 12–46)
MCH: 30.5 pg (ref 26.0–34.0)
MCHC: 34.4 g/dL (ref 30.0–36.0)
MCV: 88.5 fL (ref 78.0–100.0)
MONO ABS: 0.5 10*3/uL (ref 0.1–1.0)
MPV: 10.9 fL (ref 8.6–12.4)
Monocytes Relative: 7 % (ref 3–12)
NEUTROS ABS: 4.4 10*3/uL (ref 1.7–7.7)
Neutrophils Relative %: 64 % (ref 43–77)
Platelets: 231 10*3/uL (ref 150–400)
RBC: 4.43 MIL/uL (ref 3.87–5.11)
RDW: 13.3 % (ref 11.5–15.5)
WBC: 6.9 10*3/uL (ref 4.0–10.5)

## 2015-03-29 NOTE — Patient Instructions (Signed)
We will call with lab results Next step MRI of brain and Chest xray if nothing found on bloodwork F/U pending results

## 2015-03-30 LAB — TSH: TSH: 0.797 u[IU]/mL (ref 0.350–4.500)

## 2015-03-30 LAB — PROTIME-INR
INR: 0.91 (ref <1.50)
PROTHROMBIN TIME: 12.3 s (ref 11.6–15.2)

## 2015-03-30 LAB — VITAMIN B12: VITAMIN B 12: 296 pg/mL (ref 211–911)

## 2015-03-30 LAB — VITAMIN D 25 HYDROXY (VIT D DEFICIENCY, FRACTURES): VIT D 25 HYDROXY: 30 ng/mL (ref 30–100)

## 2015-03-31 NOTE — Progress Notes (Signed)
Patient ID: Janet [name], female   DOB: [birth year], 39 y.o.   MRN: [id number]   Subjective:    Patient ID: Janet [name], female    DOB: [birth year], 39 y.o.   MRN: [id number]  Patient presents for L Thigh Pain; Bruising; and Fatigue/ Anxiety   Pt here with episodes of spontaneous bruising over past few weeks, only area of pain is in her left thigh where she thinked she injured it back in April while moving furniture yet continues to have random pain and bruising. She is able to do her regular activites, works as a Lawyer. She notices if anything touches her skin with pressure it will leave a bruise. She has not taken any blood thinners, no new medications.  She also feels extremely fatigued and at times like she may have a seizure. She had 1 epilipetic seizure in her 54's and has not had one since then. She gets aura with her vision before she feels like she is tremoring inside. She has not sure if this was just her anxiety/panic attacks, this has been present for the past couple of months.  She is being treated by [facility] for PTSD.  When asked about diet, she does drink a lot of caffiene/  She also notes she continues to drop weight unintentionally, she is typically in the mid 120's, now down to 110.   Review Of Systems:  GEN- + fatigue, fever, +weight loss,weakness, recent illness HEENT- denies eye drainage, change in vision, nasal discharge, CVS- denies chest pain, palpitations RESP- denies SOB, cough, wheeze ABD- denies N/V, change in stools, abd pain GU- denies dysuria, hematuria, dribbling, incontinence MSK- + joint pain, +muscle aches, injury Neuro- denies headache, dizziness, syncope, seizure activity       Objective:    BP 129/70 mmHg  Pulse 82  Temp(Src) 98.1 F (36.7 C) (Oral)  Resp 14  Ht 5\' 4"  (1.626 m)  Wt 110 lb (49.896 kg)  BMI 18.87 kg/m2 GEN- NAD, alert and oriented x3, down `10lbs in 4 months HEENT- PERRL, EOMI, non injected sclera, pink  conjunctiva, MMM, oropharynx clear Neck- Supple, no thyromegaly CVS- RRR, no murmur RESP-CTAB ABD-NABS,soft,NT,ND MSK- TTP left quad about 2-3 cm above insertion, no gross deformity, able to flex quad, faded bruising noted at spot of tenderness Skin- multuple yellow/blue areas of bruising on bilat legs, arms, no lesions on back, chest, abdomen, NT, no palpable hematoma Psych- normal affect and mood Neuro-CNII-XII intact, no focal deficits EXT- No edema Pulses- Radial, DP- 2+        Assessment & Plan:      Problem List Items Addressed This Visit    None    Visit Diagnoses    Abnormal bruising    -  Primary    unclear cause, check PT/INR, LFT, no new meds, no herbals, no history of any injury or abuse    Relevant Orders    CBC with Differential/Platelet (Completed)    Protime-INR (Completed)    Anxiety state        Continue with psychiatry    Loss of weight        Plan if labs are negative, CXR and MRI of brain, she is a smoker ,s/p hysterectomy    Relevant Orders    Comprehensive metabolic panel (Completed)    TSH (Completed)    Encounter for vitamin deficiency screening        Relevant Orders    Vitamin B12 (Completed)    Vitamin D, 25-hydroxy (  Completed)    Quadriceps strain, left, initial encounter        ? if she strained or maybe had a small tear where she has the pain and bruising, she is able to ambulate and do regular activites with mild intermittant pain       Note: This dictation was prepared with Dragon dictation along with smaller phrase technology. Any transcriptional errors that result from this process are unintentional.

## 2015-04-01 DIAGNOSIS — R251 Tremor, unspecified: Secondary | ICD-10-CM | POA: Insufficient documentation

## 2015-04-01 NOTE — Assessment & Plan Note (Signed)
No tremor noted on exam, neurologically intact, however history of seizure disorder in the past with no true cause. If labs normal, no metabolic problems with her weight loss and ongoing symptoms will obtain MRI of brain

## 2015-04-01 NOTE — Addendum Note (Signed)
Addended by: Milinda Antis F on: 04/01/2015 10:16 AM   Modules accepted: Orders

## 2015-04-03 ENCOUNTER — Ambulatory Visit (HOSPITAL_COMMUNITY)
Admission: RE | Admit: 2015-04-03 | Discharge: 2015-04-03 | Disposition: A | Discharge status: Home / Self Care | Payer: 59 | Source: Ambulatory Visit | Attending: Family Medicine | Admitting: Family Medicine

## 2015-04-03 DIAGNOSIS — R634 Abnormal weight loss: Secondary | ICD-10-CM

## 2015-04-03 DIAGNOSIS — D72829 Elevated white blood cell count, unspecified: Secondary | ICD-10-CM | POA: Insufficient documentation

## 2015-04-03 DIAGNOSIS — F17210 Nicotine dependence, cigarettes, uncomplicated: Secondary | ICD-10-CM | POA: Diagnosis not present

## 2015-04-03 DIAGNOSIS — F17200 Nicotine dependence, unspecified, uncomplicated: Secondary | ICD-10-CM

## 2015-04-05 ENCOUNTER — Encounter: Payer: Self-pay | Admitting: *Deleted

## 2015-04-09 ENCOUNTER — Other Ambulatory Visit: Payer: 59

## 2015-04-11 ENCOUNTER — Telehealth: Payer: Self-pay | Admitting: *Deleted

## 2015-04-11 NOTE — Telephone Encounter (Signed)
Received note from [employer] stating that they recently reviewed a pre-service request for this pt with authorization reference of [id number] with start date of 04/02/15 and an end date of 05/17/15 and is notifying me that pt is not current with her premium payments. She is in her third month of grace period.   If the claim is submitted, it may be denied if premuim is not paid in full before grace period ends.  A copy has been sent to pt per [facility] .

## 2015-04-16 ENCOUNTER — Other Ambulatory Visit: Payer: Self-pay | Admitting: Family Medicine

## 2015-04-16 MED ORDER — ESTROGENS CONJUGATED 0.3 MG PO TABS: ORAL_TABLET | ORAL | Status: DC

## 2015-04-16 NOTE — Telephone Encounter (Signed)
Medication refilled per protocol. 

## 2015-08-30 ENCOUNTER — Other Ambulatory Visit: Payer: Self-pay | Admitting: *Deleted

## 2015-08-30 MED ORDER — ESTROGENS CONJUGATED 0.3 MG PO TABS: ORAL_TABLET | ORAL | Status: DC

## 2015-08-30 NOTE — Telephone Encounter (Signed)
Received call from pharmacy requesting refill on Premerin.   Refill appropriate and filled per protocol.

## 2015-11-07 IMAGING — CR DG CHEST 2V
2 series · 2 of 2 positions shown · non-contrast
Comparison: 03/23/2013

CLINICAL DATA: TB screening, positive PPD

EXAM:
CHEST  2 VIEW

[view not recorded (1 of 2)]
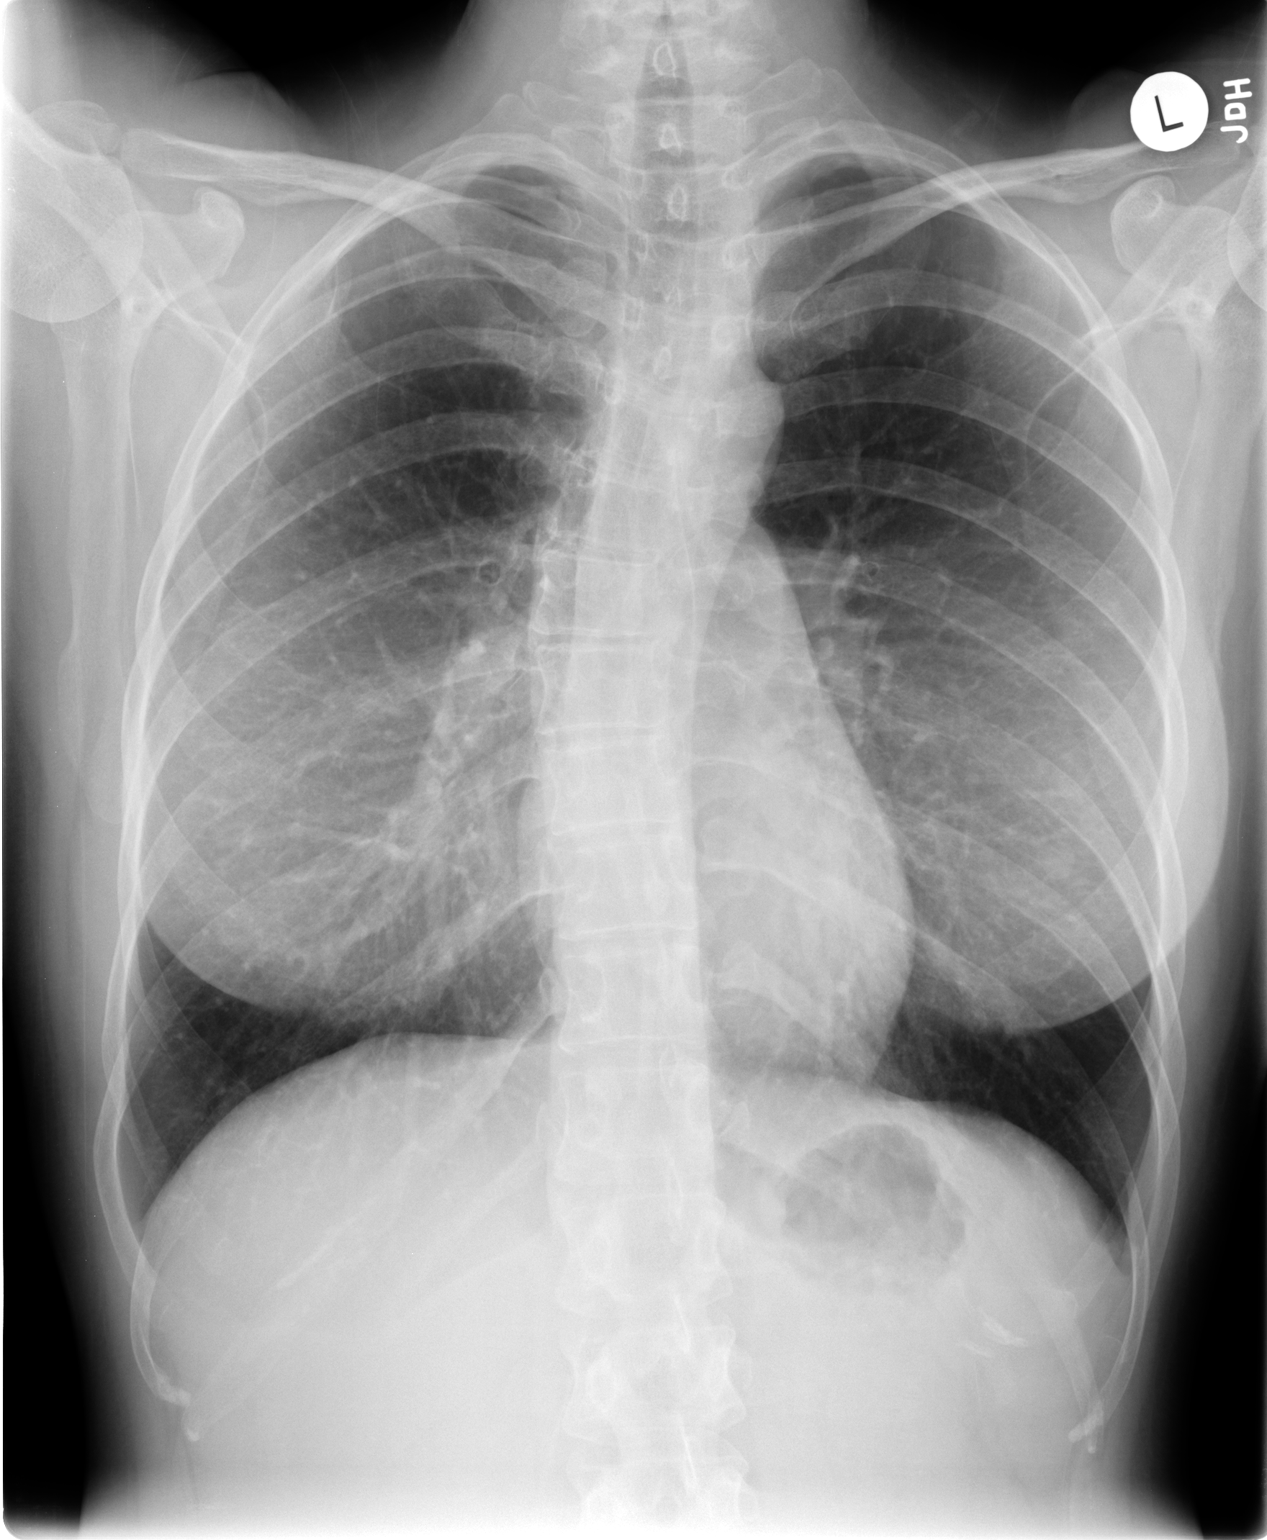

[view not recorded (2 of 2)]
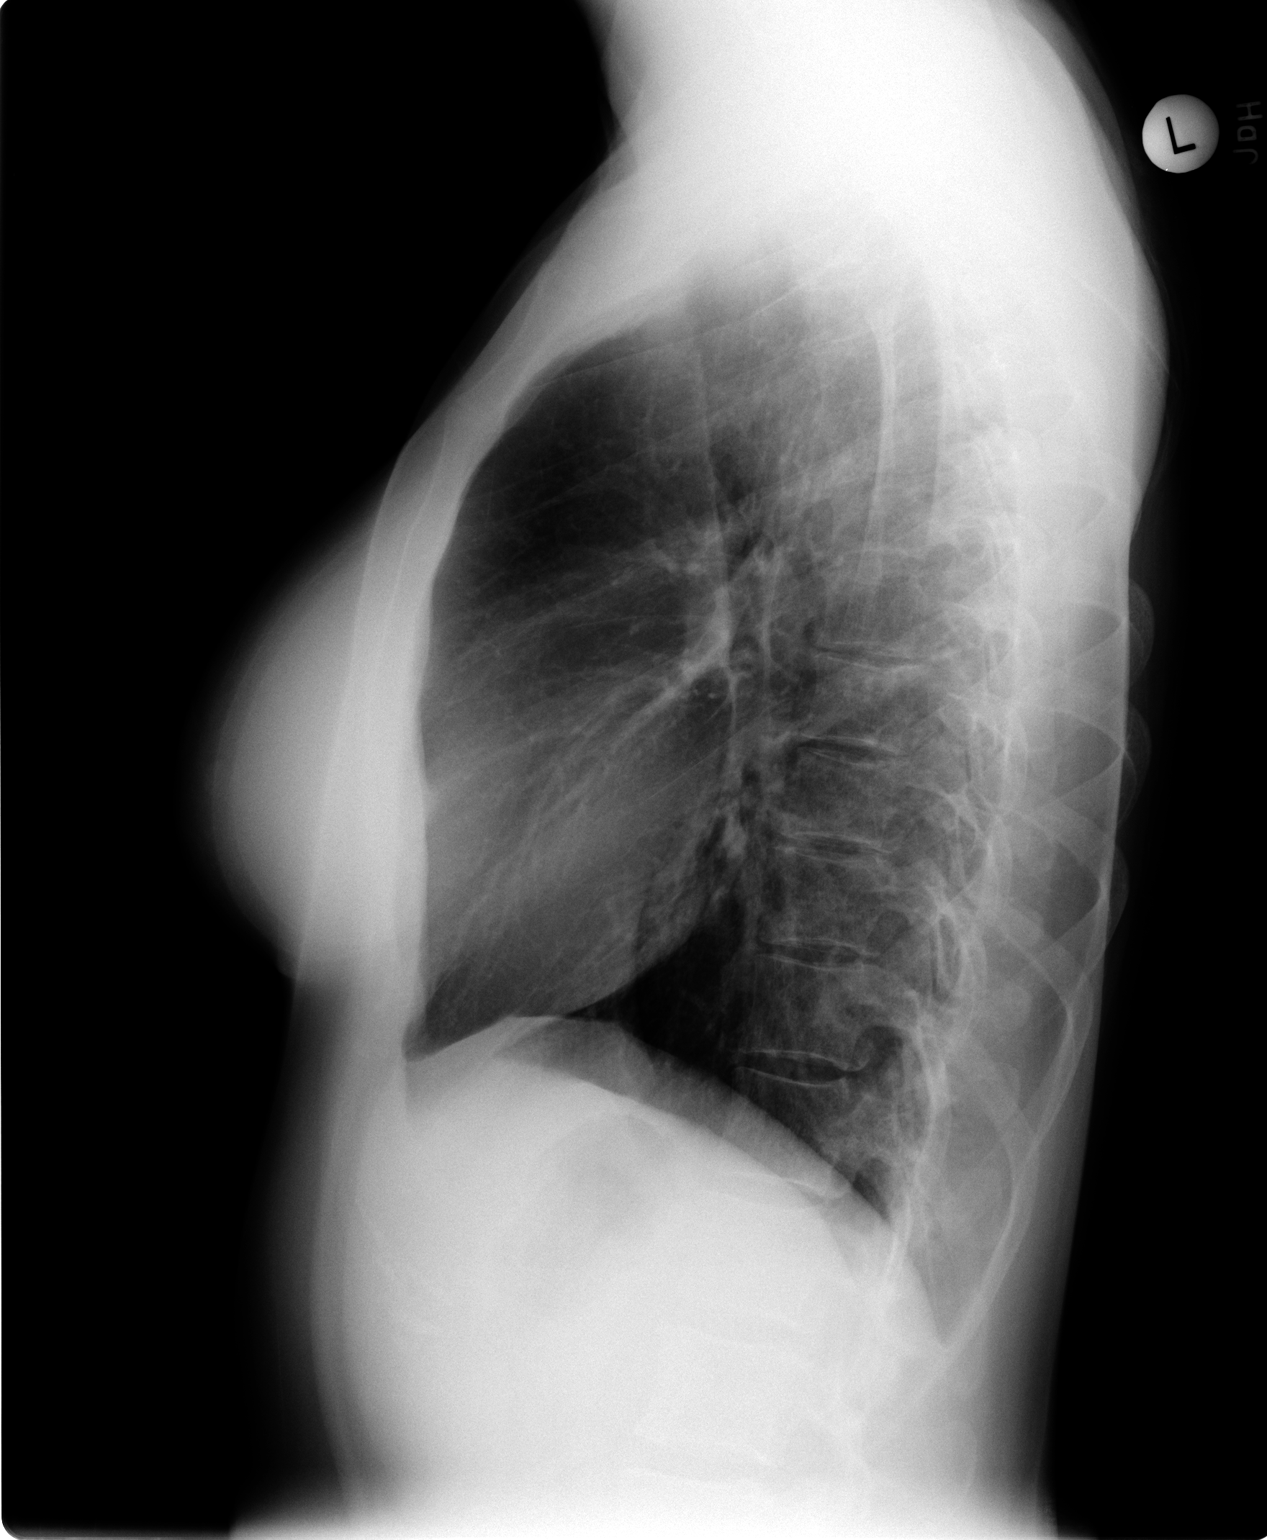

[2 of 2 positions shown; findings below may reference images not displayed]

FINDINGS: Cardiomediastinal silhouette is stable. Again noted mid thoracic
dextroscoliosis. No acute infiltrate or pleural effusion. No
pulmonary edema. No adenopathy is suggested.
IMPRESSION: No active cardiopulmonary disease.

## 2016-11-20 ENCOUNTER — Ambulatory Visit: Payer: Self-pay | Admitting: Family Medicine

## 2016-11-23 ENCOUNTER — Ambulatory Visit: Payer: Self-pay | Admitting: Family Medicine

## 2016-11-27 ENCOUNTER — Ambulatory Visit: Payer: Self-pay | Admitting: Family Medicine

## 2016-12-01 ENCOUNTER — Ambulatory Visit (INDEPENDENT_AMBULATORY_CARE_PROVIDER_SITE_OTHER): Payer: BLUE CROSS/BLUE SHIELD | Admitting: Family Medicine

## 2016-12-01 ENCOUNTER — Encounter: Payer: Self-pay | Admitting: Family Medicine

## 2016-12-01 VITALS — BP 132/78 | HR 80 | Temp 98.0°F | Resp 14 | Ht 64.0 in | Wt 143.0 lb

## 2016-12-01 DIAGNOSIS — R232 Flushing: Secondary | ICD-10-CM | POA: Diagnosis not present

## 2016-12-01 DIAGNOSIS — N951 Menopausal and female climacteric states: Secondary | ICD-10-CM

## 2016-12-01 DIAGNOSIS — J0100 Acute maxillary sinusitis, unspecified: Secondary | ICD-10-CM | POA: Diagnosis not present

## 2016-12-01 DIAGNOSIS — J301 Allergic rhinitis due to pollen: Secondary | ICD-10-CM

## 2016-12-01 LAB — CBC WITH DIFFERENTIAL/PLATELET
BASOS PCT: 0 %
Basophils Absolute: 0 cells/uL (ref 0–200)
EOS ABS: 183 {cells}/uL (ref 15–500)
Eosinophils Relative: 3 %
HEMATOCRIT: 41.2 % (ref 35.0–45.0)
HEMOGLOBIN: 13.8 g/dL (ref 12.0–15.0)
LYMPHS ABS: 2318 {cells}/uL (ref 850–3900)
Lymphocytes Relative: 38 %
MCH: 30.7 pg (ref 27.0–33.0)
MCHC: 33.5 g/dL (ref 32.0–36.0)
MCV: 91.8 fL (ref 80.0–100.0)
MONO ABS: 305 {cells}/uL (ref 200–950)
MPV: 10.9 fL (ref 7.5–12.5)
Monocytes Relative: 5 %
NEUTROS ABS: 3294 {cells}/uL (ref 1500–7800)
Neutrophils Relative %: 54 %
Platelets: 218 10*3/uL (ref 140–400)
RBC: 4.49 MIL/uL (ref 3.80–5.10)
RDW: 13.2 % (ref 11.0–15.0)
WBC: 6.1 10*3/uL (ref 3.8–10.8)

## 2016-12-01 LAB — COMPREHENSIVE METABOLIC PANEL
ALT: 10 U/L (ref 6–29)
AST: 11 U/L (ref 10–30)
Albumin: 4.3 g/dL (ref 3.6–5.1)
Alkaline Phosphatase: 58 U/L (ref 33–115)
BILIRUBIN TOTAL: 0.4 mg/dL (ref 0.2–1.2)
BUN: 13 mg/dL (ref 7–25)
CALCIUM: 9.7 mg/dL (ref 8.6–10.2)
CO2: 23 mmol/L (ref 20–31)
Chloride: 102 mmol/L (ref 98–110)
Creat: 0.74 mg/dL (ref 0.50–1.10)
GLUCOSE: 94 mg/dL (ref 70–99)
POTASSIUM: 4.4 mmol/L (ref 3.5–5.3)
Sodium: 139 mmol/L (ref 135–146)
Total Protein: 6.8 g/dL (ref 6.1–8.1)

## 2016-12-01 LAB — TSH: TSH: 0.6 m[IU]/L

## 2016-12-01 MED ORDER — ESTROGENS CONJUGATED 0.45 MG PO TABS: 0.4500 mg | ORAL_TABLET | Freq: Every day | ORAL | 6 refills | Status: DC

## 2016-12-01 MED ORDER — TRAZODONE HCL 50 MG PO TABS: 25.0000 mg | ORAL_TABLET | Freq: Every evening | ORAL | 3 refills | Status: DC | PRN

## 2016-12-01 MED ORDER — FLUCONAZOLE 150 MG PO TABS: 150.0000 mg | ORAL_TABLET | Freq: Once | ORAL | 0 refills | Status: AC

## 2016-12-01 MED ORDER — AMOXICILLIN 875 MG PO TABS: 875.0000 mg | ORAL_TABLET | Freq: Two times a day (BID) | ORAL | 0 refills | Status: DC

## 2016-12-01 MED ORDER — METHYLPREDNISOLONE ACETATE 40 MG/ML IJ SUSP
40.0000 mg | Freq: Once | INTRAMUSCULAR | Status: AC
  Administered 2016-12-01: 40 mg via INTRAMUSCULAR

## 2016-12-01 NOTE — Patient Instructions (Addendum)
Take amoxicllin  Take zyrtec and flonase every day  For sleep try trazodone - take 1 hour before bedtime Hormone increased to 0.45mg  once a day  F/U as needed

## 2016-12-01 NOTE — Addendum Note (Signed)
Addended by: Phillips Odor on: 12/01/2016 12:45 PM   Modules accepted: Orders

## 2016-12-01 NOTE — Progress Notes (Signed)
   Subjective:    Patient ID: Janet [name], female    DOB: 1976-02-25, 41 y.o.   MRN: [id number]  Patient presents for Allergies and Check hormones - having hot flashes  Here with a few concerns. She left our office about a year and a half ago secondary to her job she moved to [place] she is now back in the area and is reestablishing. She's history of hysterectomy at age 44 therefore putting her in early menopause. She's been on Premarin feels like her hormone and a cc be adjusted she is fatigued a lot is a lot of hot flashes and has been moody.  She still seen her psychiatrist who treats her ADD and anxiety. She is still on Adderall as well as temazepam. She's not been sleeping very well she is now working third shift as a care provider she is asking for something temporarily to help with her sleep   She's history of allergies. She typically takes antihistamine year-round. The past few weeks she's been taking Zyrtec along with Benadryl and occasional use of nasal spray. She now feels clogged up has pain and tenderness that is subtle on the right side of her face into her teeth in her ear she has not had any fever she has mild cough from drainage.   Review Of Systems:  GEN- + fatigue, fever, weight loss,weakness, recent illness HEENT- denies eye drainage, change in vision, +nasal discharge, CVS- denies chest pain, palpitations RESP- denies SOB, cough, wheeze ABD- denies N/V, change in stools, abd pain GU- denies dysuria, hematuria, dribbling, incontinence MSK- denies joint pain, muscle aches, injury Neuro- denies headache, dizziness, syncope, seizure activity       Objective:    BP 132/78   Pulse 80   Temp 98 F (36.7 C) (Oral)   Resp 14   Ht  (1.626 m)   Wt 143 lb (64.9 kg)   BMI 24.55 kg/m  GEN- NAD, alert and oriented x3 HEENT- PERRL, EOMI, non injected sclera, pink conjunctiva, MMM, oropharynx clear, left TM clear, Right TM clear fluid noted , no erythema,  no bulge  + maxillary sinus tenderness, inflammed turbinates, + Nasal drainage  Neck- Supple, no LAD CVS- RRR, no murmur RESP-CTAB EXT- No edema Pulses- Radial 2+         Assessment & Plan:      Problem List Items Addressed This Visit    Post menopausal syndrome - Primary    Check labs but will  Plan to increase her premarin to 0.45mg  once a day  Blood pressure looks good Check CBC, TSH as well  She does have multiple factors that could be causing the fatigue, but not hot flashes       Relevant Orders   CBC with Differential/Platelet   Comprehensive metabolic panel   TSH   FSH/LH   Hot flashes    Other Visit Diagnoses    Acute non-recurrent maxillary sinusitis       treat sinusitis, given depo medrol,amoxicllin, daily nasal steroid.Gets yeast after antibiotics-diflucan given   Relevant Medications   amoxicillin (AMOXIL) 875 MG tablet   fluconazole (DIFLUCAN) 150 MG tablet   Seasonal allergic rhinitis due to pollen          Note: This dictation was prepared with Dragon dictation along with smaller phrase technology. Any transcriptional errors that result from this process are unintentional.

## 2016-12-01 NOTE — Assessment & Plan Note (Addendum)
Check labs but will  Plan to increase her premarin to 0.45mg  once a day  Blood pressure looks good Check CBC, TSH as well  She does have multiple factors that could be causing the fatigue, but not hot flashes

## 2016-12-02 LAB — FSH/LH
FSH: 76.7 m[IU]/mL
LH: 35.8 m[IU]/mL

## 2016-12-03 ENCOUNTER — Encounter: Payer: Self-pay | Admitting: *Deleted

## 2017-01-15 ENCOUNTER — Ambulatory Visit: Payer: BLUE CROSS/BLUE SHIELD | Admitting: Family Medicine

## 2017-01-22 ENCOUNTER — Telehealth: Payer: Self-pay | Admitting: Family Medicine

## 2017-01-22 MED ORDER — ONDANSETRON HCL 4 MG PO TABS: 4.0000 mg | ORAL_TABLET | Freq: Three times a day (TID) | ORAL | 0 refills | Status: DC | PRN

## 2017-01-22 NOTE — Telephone Encounter (Signed)
Pt is going deep sea fishing tomorrow and wants to know if we can call her in zofran for nausea. [facility] [place].

## 2017-01-22 NOTE — Telephone Encounter (Signed)
zofran 4 mg poq6 hrs prn n/v 20

## 2017-01-22 NOTE — Telephone Encounter (Signed)
Medication called/sent to requested pharmacy and pt aware 

## 2017-04-01 ENCOUNTER — Other Ambulatory Visit: Payer: Self-pay | Admitting: Family Medicine

## 2017-04-15 ENCOUNTER — Telehealth: Payer: Self-pay | Admitting: Family Medicine

## 2017-04-15 NOTE — Telephone Encounter (Signed)
New Message  Pt c/o of Chest Pain: STAT if CP now or developed within 24 hours  1. Are you having CP right now? Yes  2. Are you experiencing any other symptoms (ex. SOB, nausea, vomiting, sweating)? Yes  3. How long have you been experiencing CP? Four Days  4. Is your CP continuous or coming and going? Coming and Going  5. Have you taken Nitroglycerin? No ?

## 2017-04-15 NOTE — Telephone Encounter (Signed)
I spoke with patient she states she has  had a Positive tb test in the past and is requesting an x-ray. Explained to patient she would need to be seen as it could be something more serious going on .   Patient did confirm she has been having chest pains for about 4 days, she states she is experiencing SOB, as well as hematemesis,Patient denies having a fever, denies having numbness or tingling in arm.   Patient states she has been having headaches.  Patient was advised to go to the ER or Urgent Care for immediate assistance

## 2017-04-22 ENCOUNTER — Encounter: Payer: Self-pay | Admitting: Family Medicine

## 2017-05-20 ENCOUNTER — Encounter: Payer: Self-pay | Admitting: Family Medicine

## 2017-05-31 ENCOUNTER — Ambulatory Visit: Payer: BLUE CROSS/BLUE SHIELD | Admitting: Family Medicine

## 2017-06-01 ENCOUNTER — Encounter: Payer: Self-pay | Admitting: Family Medicine

## 2017-06-01 ENCOUNTER — Ambulatory Visit (INDEPENDENT_AMBULATORY_CARE_PROVIDER_SITE_OTHER): Payer: BLUE CROSS/BLUE SHIELD | Admitting: Family Medicine

## 2017-06-01 VITALS — BP 130/72 | HR 96 | Temp 98.1°F | Resp 14 | Ht 64.0 in | Wt 141.0 lb

## 2017-06-01 DIAGNOSIS — R059 Cough, unspecified: Secondary | ICD-10-CM

## 2017-06-01 DIAGNOSIS — J0100 Acute maxillary sinusitis, unspecified: Secondary | ICD-10-CM

## 2017-06-01 DIAGNOSIS — R05 Cough: Secondary | ICD-10-CM

## 2017-06-01 DIAGNOSIS — Z77090 Contact with and (suspected) exposure to asbestos: Secondary | ICD-10-CM

## 2017-06-01 DIAGNOSIS — J040 Acute laryngitis: Secondary | ICD-10-CM

## 2017-06-01 MED ORDER — FLUTICASONE PROPIONATE 50 MCG/ACT NA SUSP: 2.0000 | Freq: Every day | NASAL | 6 refills | Status: DC

## 2017-06-01 MED ORDER — IPRATROPIUM BROMIDE 0.03 % NA SOLN: 2.0000 | Freq: Two times a day (BID) | NASAL | 12 refills | Status: AC

## 2017-06-01 MED ORDER — METHYLPREDNISOLONE 4 MG PO TBPK: ORAL_TABLET | ORAL | 0 refills | Status: DC

## 2017-06-01 MED ORDER — LEVOFLOXACIN 500 MG PO TABS: 500.0000 mg | ORAL_TABLET | Freq: Every day | ORAL | 0 refills | Status: DC

## 2017-06-01 NOTE — Patient Instructions (Signed)
[address] at [address] , [address] Take antibiotics/ prednisone Atrovent nasal spray  F/U as needed

## 2017-06-01 NOTE — Progress Notes (Signed)
   Subjective:    Patient ID: Janet [name], female    DOB: 02/12/1976, 41 y.o.   MRN: [id number]  Patient presents for Illness (x2 weeks- hoarse, ear pressure, nasal drainage)  She is here with sinus pressure drainage hoarse voice ear pain worsening over the past 2 weeks.  She has chronic underlying sinusitis in general.  She uses Nettie pot Flonase she has had septoplasty done has follow-up with ear nose and throat in about 2 weeks.  She is also concerned as she has had some cough and occasional difficulty breathing or catching her breath.  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 She did have a few occasions where she had blood-tinged sputum but the last event was about 2 months ago.  She has cut down her smoking to just 1 cigarette a day.     Review Of Systems:  GEN- denies fatigue, fever, weight loss,weakness, recent illness HEENT- denies eye drainage, change in vision, +nasal discharge, CVS- denies chest pain, palpitations RESP- denies SOB, +cough, wheeze ABD- denies N/V, change in stools, abd pain GU- denies dysuria, hematuria, dribbling, incontinence MSK- denies joint pain, muscle aches, injury Neuro- denies headache, dizziness, syncope, seizure activity       Objective:    BP 130/72   Pulse 96   Temp 98.1 F (36.7 C) (Oral)   Resp 14   Ht 5\' 4"  (1.626 m)   Wt 141 lb (64 kg)   SpO2 98%   BMI 24.20 kg/m  GEN- NAD, alert and oriented x3 HEENT- PERRL, EOMI, non injected sclera, pink conjunctiva, MMM, oropharynx mild injection, TM clear bilat no effusion,  + maxillary sinus tenderness, inflammed turbinates,  Nasal drainage  Neck- Supple, no LAD CVS- RRR, no murmur RESP-CTAB EXT- No edema Pulses- Radial 2+         Assessment & Plan:      Problem List Items Addressed This Visit    None    Visit Diagnoses    Asbestos exposure    -  Primary   Acute maxillary sinusitis, recurrence not specified       acute  on chronic sinusitis, exposure to asbestsos. CXR to be done, start levaquin, medrol dosepak, trial of atrovent, hold flonase, f/u ENT 2 weeks Oxygen sat are normal   Relevant Medications   levofloxacin (LEVAQUIN) 500 MG tablet   methylPREDNISolone (MEDROL DOSEPAK) 4 MG TBPK tablet   ipratropium (ATROVENT) 0.03 % nasal spray   fluticasone (FLONASE) 50 MCG/ACT nasal spray   Other Relevant Orders   DG Chest 2 View   Cough       Relevant Orders   DG Chest 2 View   Laryngitis          Note: This dictation was prepared with Dragon dictation along with smaller phrase technology. Any transcriptional errors that result from this process are unintentional.

## 2017-06-03 ENCOUNTER — Other Ambulatory Visit: Payer: Self-pay | Admitting: *Deleted

## 2017-06-03 MED ORDER — FLUCONAZOLE 150 MG PO TABS: 150.0000 mg | ORAL_TABLET | Freq: Once | ORAL | 0 refills | Status: AC

## 2017-06-03 NOTE — Telephone Encounter (Signed)
Received call from patient.   Reports that she was given ABTx and now she thinks she may have yeast infection. Requested order for diflucan.   Prescription sent to pharmacy for Diflucan. Advised that if S/Sx do not resolve after dosage, OV will be required.

## 2017-06-04 ENCOUNTER — Ambulatory Visit
Admission: RE | Admit: 2017-06-04 | Discharge: 2017-06-04 | Disposition: A | Discharge status: Home / Self Care | Payer: BLUE CROSS/BLUE SHIELD | Source: Ambulatory Visit | Attending: Family Medicine | Admitting: Family Medicine

## 2017-06-04 DIAGNOSIS — R059 Cough, unspecified: Secondary | ICD-10-CM

## 2017-06-04 DIAGNOSIS — R05 Cough: Secondary | ICD-10-CM

## 2017-06-04 DIAGNOSIS — J0100 Acute maxillary sinusitis, unspecified: Secondary | ICD-10-CM

## 2017-08-02 ENCOUNTER — Other Ambulatory Visit: Payer: Self-pay | Admitting: *Deleted

## 2017-08-02 MED ORDER — ESTROGENS CONJUGATED 0.45 MG PO TABS: 0.4500 mg | ORAL_TABLET | Freq: Every day | ORAL | 2 refills | Status: DC

## 2017-08-31 ENCOUNTER — Ambulatory Visit: Payer: BLUE CROSS/BLUE SHIELD | Admitting: Family Medicine

## 2017-09-02 ENCOUNTER — Other Ambulatory Visit: Payer: Self-pay | Admitting: *Deleted

## 2017-09-02 MED ORDER — TRAZODONE HCL 50 MG PO TABS: ORAL_TABLET | ORAL | 0 refills | Status: DC

## 2017-12-05 ENCOUNTER — Other Ambulatory Visit: Payer: Self-pay | Admitting: Family Medicine

## 2018-03-07 ENCOUNTER — Other Ambulatory Visit: Payer: Self-pay | Admitting: Family Medicine

## 2018-03-22 ENCOUNTER — Encounter: Payer: Self-pay | Admitting: Family Medicine

## 2018-03-22 ENCOUNTER — Other Ambulatory Visit: Payer: Self-pay | Admitting: *Deleted

## 2018-03-22 ENCOUNTER — Ambulatory Visit: Payer: BLUE CROSS/BLUE SHIELD | Admitting: Family Medicine

## 2018-03-22 ENCOUNTER — Other Ambulatory Visit: Payer: Self-pay

## 2018-03-22 VITALS — BP 110/62 | HR 68 | Temp 97.9°F | Resp 14 | Ht 64.0 in | Wt 137.0 lb

## 2018-03-22 DIAGNOSIS — N951 Menopausal and female climacteric states: Secondary | ICD-10-CM

## 2018-03-22 DIAGNOSIS — J301 Allergic rhinitis due to pollen: Secondary | ICD-10-CM

## 2018-03-22 DIAGNOSIS — R232 Flushing: Secondary | ICD-10-CM | POA: Diagnosis not present

## 2018-03-22 DIAGNOSIS — F5104 Psychophysiologic insomnia: Secondary | ICD-10-CM

## 2018-03-22 DIAGNOSIS — Z1231 Encounter for screening mammogram for malignant neoplasm of breast: Secondary | ICD-10-CM

## 2018-03-22 DIAGNOSIS — Z1239 Encounter for other screening for malignant neoplasm of breast: Secondary | ICD-10-CM

## 2018-03-22 DIAGNOSIS — J309 Allergic rhinitis, unspecified: Secondary | ICD-10-CM | POA: Insufficient documentation

## 2018-03-22 MED ORDER — TRAZODONE HCL 50 MG PO TABS: ORAL_TABLET | ORAL | 1 refills | Status: DC

## 2018-03-22 MED ORDER — ONDANSETRON HCL 4 MG PO TABS: 4.0000 mg | ORAL_TABLET | Freq: Three times a day (TID) | ORAL | 0 refills | Status: DC | PRN

## 2018-03-22 MED ORDER — TRAZODONE HCL 50 MG PO TABS: ORAL_TABLET | ORAL | 0 refills | Status: DC

## 2018-03-22 MED ORDER — CETIRIZINE HCL 10 MG PO TABS: 10.0000 mg | ORAL_TABLET | ORAL | 11 refills | Status: DC

## 2018-03-22 MED ORDER — ESTROGENS CONJUGATED 0.45 MG PO TABS: 0.4500 mg | ORAL_TABLET | Freq: Every day | ORAL | 0 refills | Status: DC

## 2018-03-22 MED ORDER — ESTROGENS CONJUGATED 0.625 MG PO TABS: 0.6250 mg | ORAL_TABLET | Freq: Every day | ORAL | 6 refills | Status: DC

## 2018-03-22 MED ORDER — MONTELUKAST SODIUM 10 MG PO TABS: 10.0000 mg | ORAL_TABLET | Freq: Every day | ORAL | 6 refills | Status: DC

## 2018-03-22 NOTE — Patient Instructions (Addendum)
Try the singulair and zyrtec  Call and get your mammogram  Schedule a physical

## 2018-03-22 NOTE — Progress Notes (Signed)
   Subjective:    Patient ID: Janet [name], female    DOB: 02-03-1976, 42 y.o.   MRN: [id number]  Patient presents for Medication Management (requested to increase Premarin)  Continued allergy problems, zyrtec, not using, using netty pot, used atrovent as needed  was using benadryl four times a day .  Denies any fever or maxillary sinus pain no significant cough. When she uses the Flonase along with her Atrovent she has had nosebleeds    Working Home Health    Mother/Aunt ahad breast cancer , mammogram     [facility] is still given,Adderall 30mg  BID prn and klonopin three times a day ,  Takes trazodone for sleep.  She feels like her hormones are off she is having hot flashes she has had mood swings feels like they were before when her medication needs to be adjusted.  She has she did try taking 2 of her Premarin and this helped her symptoms but she was wary of that dose therefore came in so that I could adjust correctly.  Note she has not had her mammogram yet  Review Of Systems:  GEN- denies fatigue, fever, weight loss,weakness, recent illness HEENT- denies eye drainage, change in vision, nasal discharge, CVS- denies chest pain, palpitations RESP- denies SOB, cough, wheeze ABD- denies N/V, change in stools, abd pain GU- denies dysuria, hematuria, dribbling, incontinence MSK- denies joint pain, muscle aches, injury Neuro- denies headache, dizziness, syncope, seizure activity       Objective:    BP 110/62   Pulse 68   Temp 97.9 F (36.6 C) (Oral)   Resp 14   Ht 5\' 4"  (1.626 m)   Wt 137 lb (62.1 kg)   SpO2 97%   BMI 23.52 kg/m  GEN- NAD, alert and oriented x3 HEENT- PERRL, EOMI, non injected sclera, pink conjunctiva, MMM, oropharynx clear, nares clear rhinorrhea, no maxillary sinus tenderness, TM clear bilat no effusion  Neck- Supple, no thyromegaly, no LAD  CVS- RRR, no murmur RESP-CTAB Psych- normal affect mood  EXT- No edema Pulses- Radial  2+       Assessment & Plan:      Problem List Items Addressed This Visit      Unprioritized   Allergic rhinitis    He is tried nasal steroids and antihistamines as well as nasal anticholinergic.  We will try her on Singulair 10 mg at bedtime along with her antihistamine.      Chronic insomnia    No change to trazodone Other meds per psychiatry      Hot flashes   Post menopausal syndrome - Primary    Increase Premarin to 0.625mg .  Discussed the importance of mammogram she is going to schedule this as soon as possible.  She does have family history of breast cancer discussed that being on the estrogen increasing her risk as well.       Other Visit Diagnoses    Breast cancer screening       Relevant Orders   MS DIGITAL SCREENING TOMO BILATERAL      Note: This dictation was prepared with Dragon dictation along with smaller phrase technology. Any transcriptional errors that result from this process are unintentional.

## 2018-03-23 ENCOUNTER — Encounter: Payer: Self-pay | Admitting: Family Medicine

## 2018-03-23 DIAGNOSIS — F5104 Psychophysiologic insomnia: Secondary | ICD-10-CM | POA: Insufficient documentation

## 2018-03-23 NOTE — Assessment & Plan Note (Signed)
He is tried nasal steroids and antihistamines as well as nasal anticholinergic.  We will try her on Singulair 10 mg at bedtime along with her antihistamine.

## 2018-03-23 NOTE — Assessment & Plan Note (Signed)
No change to trazodone Other meds per psychiatry

## 2018-03-23 NOTE — Assessment & Plan Note (Signed)
Increase Premarin to 0.625mg .  Discussed the importance of mammogram she is going to schedule this as soon as possible.  She does have family history of breast cancer discussed that being on the estrogen increasing her risk as well.

## 2018-04-15 ENCOUNTER — Encounter: Payer: BLUE CROSS/BLUE SHIELD | Admitting: Family Medicine

## 2018-04-20 ENCOUNTER — Encounter: Payer: BLUE CROSS/BLUE SHIELD | Admitting: Family Medicine

## 2018-04-28 ENCOUNTER — Other Ambulatory Visit: Payer: Self-pay | Admitting: Family Medicine

## 2018-05-03 ENCOUNTER — Encounter: Payer: Self-pay | Admitting: Family Medicine

## 2018-05-13 ENCOUNTER — Ambulatory Visit: Payer: BLUE CROSS/BLUE SHIELD | Admitting: Family Medicine

## 2018-05-24 ENCOUNTER — Ambulatory Visit: Payer: BLUE CROSS/BLUE SHIELD

## 2018-06-06 ENCOUNTER — Other Ambulatory Visit: Payer: Self-pay | Admitting: Family Medicine

## 2018-08-15 ENCOUNTER — Ambulatory Visit: Payer: BLUE CROSS/BLUE SHIELD | Admitting: Family Medicine

## 2018-08-15 ENCOUNTER — Encounter: Payer: Self-pay | Admitting: Family Medicine

## 2018-08-15 ENCOUNTER — Other Ambulatory Visit: Payer: Self-pay

## 2018-08-15 VITALS — BP 122/66 | HR 64 | Temp 98.9°F | Resp 14 | Ht 64.0 in | Wt 147.0 lb

## 2018-08-15 DIAGNOSIS — J0100 Acute maxillary sinusitis, unspecified: Secondary | ICD-10-CM

## 2018-08-15 DIAGNOSIS — B349 Viral infection, unspecified: Secondary | ICD-10-CM | POA: Diagnosis not present

## 2018-08-15 LAB — INFLUENZA A AND B AG, IMMUNOASSAY
INFLUENZA A ANTIGEN: NOT DETECTED
INFLUENZA B ANTIGEN: NOT DETECTED

## 2018-08-15 MED ORDER — AMOXICILLIN 875 MG PO TABS: 875.0000 mg | ORAL_TABLET | Freq: Two times a day (BID) | ORAL | 0 refills | Status: DC

## 2018-08-15 MED ORDER — FLUCONAZOLE 150 MG PO TABS: 150.0000 mg | ORAL_TABLET | Freq: Once | ORAL | 1 refills | Status: AC

## 2018-08-15 NOTE — Progress Notes (Signed)
   Subjective:    Patient ID: Janet [name], female    DOB: 12/12/1975, 43 y.o.   MRN: [id number]  Patient presents for Illness (x4 days- fever, body aches, hoarse, semiproductive cough)   Pt here illness for past 4 days. Body aches, cough with mild production, sinus pressure and drainge, headache, post nasal drip  vomiting a few times, no diarrhea. Fever Tmax  102F last night  Hoarse voice  She is keeping fluids in.   No flu shot      Review Of Systems: Per above  GEN- +fatigue, +fever, weight loss,weakness, recent illness HEENT- denies eye drainage, change in vision, +nasal discharge, CVS- denies chest pain, palpitations RESP- denies SOB,+ cough, wheeze ABD- denies N/V, change in stools, abd pain GU- denies dysuria, hematuria, dribbling, incontinence MSK- denies joint pain, +muscle aches, injury Neuro- denies headache, dizziness, syncope, seizure activity       Objective:    BP 122/66   Pulse 64   Temp 98.9 F (37.2 C) (Oral)   Resp 14   Ht 5\' 4"  (1.626 m)   Wt 147 lb (66.7 kg)   SpO2 97%   BMI 25.23 kg/m  GEN- NAD, alert and oriented x3, sick appearing HEENT- PERRL, EOMI, non injected sclera, pink conjunctiva, MMM, oropharynx clear, nares nasal congestion, mild maxillary sinus tenderness, TM clear bilat no effusion, hoarse voice  Neck- Supple, no LAD CVS- RRR, no murmur RESP-CTAB ABD-NABS,soft,NT,ND EXT- No edema Pulses- Radial 2+        Assessment & Plan:      Problem List Items Addressed This Visit    None    Visit Diagnoses    Acute maxillary sinusitis, recurrence not specified    -  Primary   Flu neg, but has underlying viral illness and sinusitis. cover with amoxcillin, diflucan due to yesast infection, nasal rinse, corcidan for cough   Relevant Medications   amoxicillin (AMOXIL) 875 MG tablet   fluconazole (DIFLUCAN) 150 MG tablet   Other Relevant Orders   Influenza A and B Ag, Immunoassay (Completed)   Viral illness       Relevant  Medications   fluconazole (DIFLUCAN) 150 MG tablet      Note: This dictation was prepared with Dragon dictation along with smaller phrase technology. Any transcriptional errors that result from this process are unintentional.

## 2018-08-15 NOTE — Patient Instructions (Signed)
F/U as needed

## 2018-09-13 ENCOUNTER — Other Ambulatory Visit: Payer: Self-pay

## 2018-09-13 ENCOUNTER — Ambulatory Visit: Payer: BLUE CROSS/BLUE SHIELD | Admitting: Family Medicine

## 2018-09-13 ENCOUNTER — Encounter: Payer: Self-pay | Admitting: Family Medicine

## 2018-09-13 VITALS — BP 122/62 | HR 80 | Temp 97.9°F | Resp 12 | Ht 64.0 in | Wt 144.0 lb

## 2018-09-13 DIAGNOSIS — N951 Menopausal and female climacteric states: Secondary | ICD-10-CM

## 2018-09-13 DIAGNOSIS — Z23 Encounter for immunization: Secondary | ICD-10-CM

## 2018-09-13 DIAGNOSIS — M7711 Lateral epicondylitis, right elbow: Secondary | ICD-10-CM

## 2018-09-13 DIAGNOSIS — Z1239 Encounter for other screening for malignant neoplasm of breast: Secondary | ICD-10-CM

## 2018-09-13 MED ORDER — DICLOFENAC SODIUM 75 MG PO TBEC: 75.0000 mg | DELAYED_RELEASE_TABLET | Freq: Two times a day (BID) | ORAL | 1 refills | Status: DC

## 2018-09-13 NOTE — Patient Instructions (Addendum)
Schedule a physical  Referral to orthopedics

## 2018-09-13 NOTE — Progress Notes (Signed)
   Subjective:    Patient ID: Janet [name], female    DOB: [birth year], 43 y.o.   MRN: [id number]  Patient presents for R Elbow Pain (x6 months- burning, sharp pain, loss of grasp- has been using ICE, compression- no relief)   Right elbow pain worsening over past 6 months, initially did a lot of pruning outside yard work when pain first started. She has been using tennis elbow compression brace and coban and still very painful. Has pain with bending elbow and different range of motion. Has been using ice, taken a few ibuprofen  Occ has some stiffness in the right shoulder   He has had to spread out her Premarin because of cost.  She is also due for mammogram.  She had lost her insurance and was off of her hormone for a couple of months.  She still gets hot flashes but she did not tolerate estradiol.  Review Of Systems:  GEN- denies fatigue, fever, weight loss,weakness, recent illness HEENT- denies eye drainage, change in vision, nasal discharge, CVS- denies chest pain, palpitations RESP- denies SOB, cough, wheeze MSK- + joint pain, muscle aches, injury Neuro- denies headache, dizziness, syncope, seizure activity       Objective:    BP 122/62   Pulse 80   Temp 97.9 F (36.6 C) (Oral)   Resp 12   Ht 5\' 4"  (1.626 m)   Wt 144 lb (65.3 kg)   SpO2 99%   BMI 24.72 kg/m  GEN- NAD, alert and oriented x3 CVS- RRR, no murmur RESP-CTAB MSK- TTP along lateral epicondyle and forearm, mild swelling, decreased ROM elbow, unable to complete extend, FROM Wrist, good ROM Right Shoulder, no erythema of joints  Ext- no edema Pulse- Radial 2+       Assessment & Plan:      Problem List Items Addressed This Visit      Unprioritized   Post menopausal syndrome - Primary    Due to cost, we discussed taking three times a week, to help with symptoms       Other Visit Diagnoses    Lateral epicondylitis of right elbow       Chronic inflammation,start diclofenac 75mg  BID, use brace,  referral to orthopedics for injection/treatment   Relevant Medications   diclofenac (VOLTAREN) 75 MG EC tablet   Other Relevant Orders   Ambulatory referral to Orthopedic Surgery   Breast cancer screening       On hormonal therapy ,due for mammogram, pt to schedule   Relevant Orders   MM 3D SCREEN BREAST BILATERAL   Need for immunization against influenza       Relevant Orders   Flu Vaccine QUAD 36+ mos IM (Completed)      Note: This dictation was prepared with Dragon dictation along with smaller phrase technology. Any transcriptional errors that result from this process are unintentional.

## 2018-09-13 NOTE — Assessment & Plan Note (Signed)
Due to cost, we discussed taking three times a week, to help with symptoms

## 2018-10-05 ENCOUNTER — Ambulatory Visit: Payer: BLUE CROSS/BLUE SHIELD | Admitting: Family Medicine

## 2018-10-05 ENCOUNTER — Other Ambulatory Visit: Payer: Self-pay

## 2018-10-05 ENCOUNTER — Encounter: Payer: Self-pay | Admitting: Family Medicine

## 2018-10-05 VITALS — BP 112/62 | HR 100 | Temp 98.1°F | Resp 12 | Ht 64.0 in | Wt 141.0 lb

## 2018-10-05 DIAGNOSIS — M7711 Lateral epicondylitis, right elbow: Secondary | ICD-10-CM

## 2018-10-05 MED ORDER — IBUPROFEN 600 MG PO TABS: 600.0000 mg | ORAL_TABLET | Freq: Four times a day (QID) | ORAL | 0 refills | Status: DC | PRN

## 2018-10-05 MED ORDER — HYDROCODONE-ACETAMINOPHEN 5-325 MG PO TABS: 1.0000 | ORAL_TABLET | Freq: Four times a day (QID) | ORAL | 0 refills | Status: DC | PRN

## 2018-10-05 NOTE — Progress Notes (Signed)
   Subjective:    Patient ID: Janet [name], female    DOB: 1975/12/17, 43 y.o.   MRN: [id number]  Patient presents for Elbow Pain (carpal tunnel- [facility] is out of network)  Pt here with ongoing pain from since her last visit on February 4. Diagnosed with Right tennis elbow  She was started on diclofenac and sent to orthopedics.  She was evaluated on February 5.  X-ray was done she states was normal they agreed with the diagnosis they gave her a brace to help offload the tendon so she would not flex at the wrist.  She was also given steroid injection but had minimal improvement with this.  The diclofenac actually made her sick on her stomach so she stopped taking it.  She did call back to ask about something else that can be used for inflammation but [facility] is not in her network for her insurance they recommend that she come back to her primary to find another orthopedic as she may need MRI in the future.  She continues to have pain and swelling of the elbow.  She is wearing the brace as they recommended.  No tingling numbness of hand    Review Of Systems:  GEN- denies fatigue, fever, weight loss,weakness, recent illness MSK- +joint pain, muscle aches, injury       Objective:    BP 112/62   Pulse 100   Temp 98.1 F (36.7 C) (Oral)   Resp 12   Ht 5\' 4"  (1.626 m)   Wt 141 lb (64 kg)   SpO2 97%   BMI 24.20 kg/m  GEN- NAD, alert and oriented x3 MSK- TTP along lateral epicondyle and forearm, mild swelling, decreased ROM elbow, unable to complete extend, FROM Wrist, good ROM Right Shoulder, no erythema of joints  Ext- no edema Pulse- Radial 2+       Assessment & Plan:      Problem List Items Addressed This Visit    None    Visit Diagnoses    Right lateral epicondylitis    -  Primary   D/C diclofenac, able to tolerate ibuprofen, start 600mg  with food, ICE, norco for pain, referral to new orthopedic for further managment, continue brace   Relevant  Medications   ibuprofen (ADVIL,MOTRIN) 600 MG tablet   HYDROcodone-acetaminophen (NORCO) 5-325 MG tablet   Other Relevant Orders   Ambulatory referral to Orthopedic Surgery      Note: This dictation was prepared with Dragon dictation along with smaller phrase technology. Any transcriptional errors that result from this process are unintentional.

## 2018-10-05 NOTE — Patient Instructions (Signed)
Continue Icing  Changed to ibuprofen/ hydocodone  Call me for orthopedic  F/U as needed

## 2018-10-06 ENCOUNTER — Telehealth: Payer: Self-pay | Admitting: *Deleted

## 2018-10-06 DIAGNOSIS — M7711 Lateral epicondylitis, right elbow: Secondary | ICD-10-CM

## 2018-10-06 NOTE — Telephone Encounter (Signed)
Received call from patient.   Reports that Dr. Betha Loa at [facility] in [place] is in network and appointment has been scheduled for next week.   Referral orders placed.

## 2018-10-11 ENCOUNTER — Other Ambulatory Visit: Payer: Self-pay | Admitting: Family Medicine

## 2018-10-13 ENCOUNTER — Telehealth: Payer: Self-pay | Admitting: *Deleted

## 2018-10-13 NOTE — Telephone Encounter (Signed)
Received call from patient.   States that she is having daily hot flashes and sweating all over. Reports that she gets so hot that she becomes nauseous and vomits. States that she has been taking Premarin for 22 days with no relief. Would like to have hormones checked.   Appointment scheduled.

## 2018-10-14 ENCOUNTER — Ambulatory Visit: Payer: BLUE CROSS/BLUE SHIELD | Admitting: Family Medicine

## 2018-10-14 ENCOUNTER — Other Ambulatory Visit: Payer: Self-pay

## 2018-10-14 ENCOUNTER — Encounter: Payer: Self-pay | Admitting: Family Medicine

## 2018-10-14 VITALS — BP 116/60 | HR 82 | Temp 97.9°F | Resp 14 | Ht 64.0 in | Wt 141.0 lb

## 2018-10-14 DIAGNOSIS — Z13220 Encounter for screening for lipoid disorders: Secondary | ICD-10-CM | POA: Diagnosis not present

## 2018-10-14 DIAGNOSIS — N951 Menopausal and female climacteric states: Secondary | ICD-10-CM

## 2018-10-14 DIAGNOSIS — R232 Flushing: Secondary | ICD-10-CM | POA: Diagnosis not present

## 2018-10-14 DIAGNOSIS — R112 Nausea with vomiting, unspecified: Secondary | ICD-10-CM | POA: Diagnosis not present

## 2018-10-14 DIAGNOSIS — Z1239 Encounter for other screening for malignant neoplasm of breast: Secondary | ICD-10-CM

## 2018-10-14 NOTE — Assessment & Plan Note (Signed)
I think her symptoms could still be contribute to her hormonal changes especially with the sweats and everything.  She is not traveled outside the country has not been exposed to anybody with tuberculosis she does not have any weight loss with her symptoms.  Some improvement with her primary but I am not going to change this dose as she has not had her mammogram.  We did discuss getting her mammogram at a site and [place] she was given a prescription for this.  I went to check her labs though her abdominal exam is fairly benign but she does have thyroid problems in her family and some persistent with hot flashes.  We will also make sure she is not anemic.  No changes made to medications today.

## 2018-10-14 NOTE — Progress Notes (Signed)
Subjective:    Patient ID: Janet [name], female    DOB: 1976-03-26, 43 y.o.   MRN: [id number]  Patient presents for Hot flashes (increased hot flashes)  Pt has appt with Hand surgery next Wed- Dr. Betha Loa    Has hot flashes first thing in the morning around 4-5am, feels so hot that she gets nausous and has vomited, breaks out in full body sweat all over for past 2 months  has been taking zofran for the episodes  Has been on back on premarin for past 22 days it has helped some    No abdominal pain, she has tried changing diet before bedtime and that has not had diarrhea or change in bowels   Still has not had mammogram due to insurance, showed me list today of people in network with her plan    Review Of Systems:  GEN- denies fatigue, fever, weight loss,weakness, recent illness HEENT- denies eye drainage, change in vision, nasal discharge, CVS- denies chest pain, palpitations RESP- denies SOB, cough, wheeze ABD- +N/V, denies change in stools, abd pain GU- denies dysuria, hematuria, dribbling, incontinence MSK- + joint pain, muscle aches, injury Neuro- denies headache, dizziness, syncope, seizure activity       Objective:    BP 116/60   Pulse 82   Temp 97.9 F (36.6 C) (Oral)   Resp 14   Ht 5\' 4"  (1.626 m)   Wt 141 lb (64 kg)   SpO2 99%   BMI 24.20 kg/m  GEN- NAD, alert and oriented x3,well appearing  HEENT- PERRL, EOMI, non injected sclera, pink conjunctiva, MMM, oropharynx clear Neck- Supple, no thyromegaly CVS- RRR, no murmur RESP-CTAB ABD-NABS,soft,NT,ND EXT- No edema, right hand in brace  Pulses- Radial  2+        Assessment & Plan:      Problem List Items Addressed This Visit      Unprioritized   Hot flashes - Primary    I think her symptoms could still be contribute to her hormonal changes especially with the sweats and everything.  She is not traveled outside the country has not been exposed to anybody with tuberculosis she does not  have any weight loss with her symptoms.  Some improvement with her primary but I am not going to change this dose as she has not had her mammogram.  We did discuss getting her mammogram at a site and [place] she was given a prescription for this.  I went to check her labs though her abdominal exam is fairly benign but she does have thyroid problems in her family and some persistent with hot flashes.  We will also make sure she is not anemic.  No changes made to medications today.      Relevant Orders   CBC with Differential/Platelet   Comprehensive metabolic panel   TSH   FSH/LH   Post menopausal syndrome   Relevant Orders   CBC with Differential/Platelet   Comprehensive metabolic panel   TSH   FSH/LH    Other Visit Diagnoses    Screening cholesterol level       Relevant Orders   Lipid panel   Nausea and vomiting, intractability of vomiting not specified, unspecified vomiting type       Relevant Orders   CBC with Differential/Platelet   Comprehensive metabolic panel   Breast cancer screening          Note: This dictation was prepared with Dragon dictation along with smaller phrase technology. Any  transcriptional errors that result from this process are unintentional.

## 2018-10-14 NOTE — Patient Instructions (Signed)
F/U pending results   

## 2018-10-15 LAB — LIPID PANEL
CHOL/HDL RATIO: 3.6 (calc) (ref <5.0)
CHOLESTEROL: 232 mg/dL — AB (ref <200)
HDL: 65 mg/dL (ref >=50)
LDL Cholesterol (Calc): 133 mg/dL (calc) — ABNORMAL HIGH
Non-HDL Cholesterol (Calc): 167 mg/dL (calc) — ABNORMAL HIGH (ref <130)
TRIGLYCERIDES: 199 mg/dL — AB (ref <150)

## 2018-10-15 LAB — CBC WITH DIFFERENTIAL/PLATELET
Absolute Monocytes: 548 cells/uL (ref 200–950)
Basophils Absolute: 50 cells/uL (ref 0–200)
Basophils Relative: 0.8 %
EOS ABS: 221 {cells}/uL (ref 15–500)
EOS PCT: 3.5 %
HCT: 39 % (ref 35.0–45.0)
HEMOGLOBIN: 13.5 g/dL (ref 11.7–15.5)
Lymphs Abs: 2602 cells/uL (ref 850–3900)
MCH: 31.3 pg (ref 27.0–33.0)
MCHC: 34.6 g/dL (ref 32.0–36.0)
MCV: 90.3 fL (ref 80.0–100.0)
MONOS PCT: 8.7 %
MPV: 11.1 fL (ref 7.5–12.5)
NEUTROS PCT: 45.7 %
Neutro Abs: 2879 cells/uL (ref 1500–7800)
PLATELETS: 224 10*3/uL (ref 140–400)
RBC: 4.32 10*6/uL (ref 3.80–5.10)
RDW: 12.2 % (ref 11.0–15.0)
TOTAL LYMPHOCYTE: 41.3 %
WBC: 6.3 10*3/uL (ref 3.8–10.8)

## 2018-10-15 LAB — COMPREHENSIVE METABOLIC PANEL
AG Ratio: 2 (calc) (ref 1.0–2.5)
ALBUMIN MSPROF: 4.3 g/dL (ref 3.6–5.1)
ALT: 13 U/L (ref 6–29)
AST: 13 U/L (ref 10–30)
Alkaline phosphatase (APISO): 44 U/L (ref 31–125)
BILIRUBIN TOTAL: 0.6 mg/dL (ref 0.2–1.2)
BUN: 13 mg/dL (ref 7–25)
CALCIUM: 9 mg/dL (ref 8.6–10.2)
CO2: 26 mmol/L (ref 20–32)
CREATININE: 0.88 mg/dL (ref 0.50–1.10)
Chloride: 101 mmol/L (ref 98–110)
GLUCOSE: 83 mg/dL (ref 65–99)
Globulin: 2.1 g/dL (calc) (ref 1.9–3.7)
Potassium: 4.6 mmol/L (ref 3.5–5.3)
SODIUM: 139 mmol/L (ref 135–146)
TOTAL PROTEIN: 6.4 g/dL (ref 6.1–8.1)

## 2018-10-15 LAB — FSH/LH
FSH: 40 m[IU]/mL
LH: 24.9 m[IU]/mL

## 2018-10-15 LAB — TSH: TSH: 1.93 m[IU]/L

## 2018-10-28 ENCOUNTER — Other Ambulatory Visit: Payer: Self-pay | Admitting: Family Medicine

## 2018-10-28 NOTE — Telephone Encounter (Signed)
Last filled: 10/05/2018 Last office visit: 10/14/2018

## 2018-11-29 ENCOUNTER — Encounter: Payer: BLUE CROSS/BLUE SHIELD | Admitting: Family Medicine

## 2018-12-23 ENCOUNTER — Telehealth: Payer: Self-pay | Admitting: Family Medicine

## 2018-12-23 NOTE — Telephone Encounter (Signed)
Patient called stating that for the past few days she has been experiencing a migraine that has caused her to have some visual disturbances, and nausea. States she has tried ibuprofen, laying in a dark room with no relief. She has been on topamax in the past but is currently out of the medication. Patient would like to know if we can send in a new prescription for this medication. Please advise?

## 2018-12-23 NOTE — Telephone Encounter (Signed)
Please call pt and clarify, I dont have her on topamax unless prescribed by someone else  If she is currently taking- get the dose, okay to refill if needed  If not on the medication, she can start topamax 25mg  at bedtime Get OV in 2 weeks via phone

## 2018-12-26 MED ORDER — TOPIRAMATE 25 MG PO CPSP: 25.0000 mg | ORAL_CAPSULE | Freq: Every day | ORAL | 1 refills | Status: DC

## 2018-12-26 NOTE — Telephone Encounter (Signed)
Spoke with patient and patient is not currently on medication. Topamax 25 mg to take at bedtime sent to pharmacy. Patient scheduled for a two week follow up.

## 2019-01-09 ENCOUNTER — Other Ambulatory Visit: Payer: Self-pay

## 2019-01-09 ENCOUNTER — Ambulatory Visit (INDEPENDENT_AMBULATORY_CARE_PROVIDER_SITE_OTHER): Payer: BLUE CROSS/BLUE SHIELD | Admitting: Family Medicine

## 2019-01-09 DIAGNOSIS — G43709 Chronic migraine without aura, not intractable, without status migrainosus: Secondary | ICD-10-CM

## 2019-01-09 DIAGNOSIS — J0100 Acute maxillary sinusitis, unspecified: Secondary | ICD-10-CM | POA: Diagnosis not present

## 2019-01-09 MED ORDER — FLUCONAZOLE 150 MG PO TABS: 150.0000 mg | ORAL_TABLET | Freq: Once | ORAL | 1 refills | Status: AC

## 2019-01-09 MED ORDER — AMOXICILLIN 875 MG PO TABS: 875.0000 mg | ORAL_TABLET | Freq: Two times a day (BID) | ORAL | 0 refills | Status: DC

## 2019-01-09 NOTE — Progress Notes (Signed)
Virtual Visit via Telephone Note  I connected with Donnie Aho on 01/09/19 at 11:54am  by telephone and verified that I am speaking with the correct person using two identifiers.     Pt location: at home   Physician location:  In office, [facility], Milinda Antis MD     On call: patient and physician   I discussed the limitations, risks, security and privacy concerns of performing an evaluation and management service by telephone and the availability of in person appointments. I also discussed with the patient that there may be a patient responsible charge related to this service. The patient expressed understanding and agreed to proceed.   History of Present Illness:  She thinks some of her headaches are sinus related , headaches have improved with topamax Then she started using netty pot which has also helped, feels like she has pressure and drainage stuck in her sinus region. Has swelling in face, no fever, no cough, she has history of septoplasty, has chronic allergies, on anti-histamine- allegra right now instead of zyrtec , atrovent   No vision changes, no N/V   lives in trailer with concern if there was some mold in her trailer.  She has been using an old prescription of Topamax which did expire but she feels like it has been helping.  She had some financial issues and was unable to pick up the new one but will do so this week   Observations/Objective: NAD, will speak on phone unable to visualize  Assessment and Plan: Acute sinusitis - AMOXICILLIN added CONTINUE Netty pot, anti-histamine and  Migraine disorder-she has some underlying migraines in the setting as well.  Which did improve with Topamax.  She is going to get the new prescription of Topamax and will continue for now.  Follow Up Instructions:    I discussed the assessment and treatment plan with the patient. The patient was provided an opportunity to ask questions and all were answered. The  patient agreed with the plan and demonstrated an understanding of the instructions.   The patient was advised to call back or seek an in-person evaluation if the symptoms worsen or if the condition fails to improve as anticipated.  I provided of non-face-to-face time during this encounter. End Time: 12:02PM  Milinda Antis, MD

## 2019-01-10 ENCOUNTER — Encounter: Payer: Self-pay | Admitting: Family Medicine

## 2019-01-10 DIAGNOSIS — G43909 Migraine, unspecified, not intractable, without status migrainosus: Secondary | ICD-10-CM | POA: Insufficient documentation

## 2019-02-02 ENCOUNTER — Telehealth: Payer: Self-pay | Admitting: General Practice

## 2019-02-02 ENCOUNTER — Encounter: Payer: Self-pay | Admitting: Family Medicine

## 2019-02-02 ENCOUNTER — Ambulatory Visit (INDEPENDENT_AMBULATORY_CARE_PROVIDER_SITE_OTHER): Payer: BLUE CROSS/BLUE SHIELD | Admitting: Family Medicine

## 2019-02-02 ENCOUNTER — Other Ambulatory Visit: Payer: Self-pay

## 2019-02-02 ENCOUNTER — Telehealth: Payer: Self-pay | Admitting: *Deleted

## 2019-02-02 DIAGNOSIS — R6889 Other general symptoms and signs: Secondary | ICD-10-CM | POA: Diagnosis not present

## 2019-02-02 DIAGNOSIS — Z20822 Contact with and (suspected) exposure to covid-19: Secondary | ICD-10-CM

## 2019-02-02 DIAGNOSIS — J069 Acute upper respiratory infection, unspecified: Secondary | ICD-10-CM | POA: Diagnosis not present

## 2019-02-02 DIAGNOSIS — R05 Cough: Secondary | ICD-10-CM

## 2019-02-02 DIAGNOSIS — R059 Cough, unspecified: Secondary | ICD-10-CM

## 2019-02-02 MED ORDER — DOXYCYCLINE HYCLATE 100 MG PO TABS: 100.0000 mg | ORAL_TABLET | Freq: Two times a day (BID) | ORAL | 0 refills | Status: AC

## 2019-02-02 MED ORDER — ALBUTEROL SULFATE HFA 108 (90 BASE) MCG/ACT IN AERS: 2.0000 | INHALATION_SPRAY | RESPIRATORY_TRACT | 0 refills | Status: DC | PRN

## 2019-02-02 MED ORDER — BENZONATATE 100 MG PO CAPS: 100.0000 mg | ORAL_CAPSULE | Freq: Three times a day (TID) | ORAL | 0 refills | Status: DC | PRN

## 2019-02-02 NOTE — Telephone Encounter (Signed)
Received call from patient.  [phone]~ telephone.   Reports that she had appointment on 01/09/2019 and given Amoxicillin for sinus infection. Reports that she continues to have some SOB, low grade fever, facial pain.   Appointment scheduled for Telehealth visit due to COIVD precautions.

## 2019-02-02 NOTE — Telephone Encounter (Signed)
-----   Message from Delsa Grana, [place] sent at 02/02/2019  3:03 PM EDT ----- Janet [name], DOB May 17, 1976, MRN [id number] Nasal sx, sinusitis sx, cough, SOB and fever Please arrange COVID testing Thank you

## 2019-02-02 NOTE — Addendum Note (Signed)
Addended by: Dimple Nanas on: 02/02/2019 03:36 PM   Modules accepted: Orders

## 2019-02-02 NOTE — Progress Notes (Signed)
Patient ID: Janet [name], female    DOB: 01/14/1976, 43 y.o.   MRN: [id number]  PCP: Alycia Rossetti, MD   Virtual Visit via telephone  Phone visit arranged with Jonita Albee for 02/02/19 at  2:30 PM EDT  Services provided today were via telemedicine through telephone call. Start of phone call:  2:39 PM  I verified that I was speaking with the correct person using two identifiers. Patient reported their location during encounter was at home   Patient consented to telephone visit  I conducted telephone visit from [facility] [place] [facility]  Referring Provider:   Alycia Rossetti, MD   All participants in encounter:  Myself and the patient  I discussed the limitations, risks, security and privacy concerns of performing an evaluation and management service by telephone and the availability of in person appointments. I also discussed with the patient that there may be a patient responsible charge related to this service. The patient expressed understanding and agreed to proceed.  Chief Complaint  Patient presents with  . Sinusitis    Subjective:   Janet [name] is a 43 y.o. female, presents to clinic with CC of sinus infection not better since 01/09/2019, took abx 6/3-6/14, felt somewhat better near the end of antibiotics, but still had nasal and chest congestion. Sx started to really really bad again tomorrow.  Sinus congestion, fever, SOB when walking around, productive cough yellow to green.  No wheeze.  HA associated with sinus.  She has sweats and chills, but no different than her normal with premarin hot flashes/sweats etc.  Not exposed to anyone sick.   Patient Active Problem List   Diagnosis Date Noted  . Migraines 01/10/2019  . Chronic insomnia 03/23/2018  . Allergic rhinitis 03/22/2018  . Tremor 04/01/2015  . Post menopausal syndrome 09/10/2014  . Hot flashes 09/10/2014    Prior to Admission medications   Medication Sig Start Date  End Date Taking? Authorizing Provider  amoxicillin (AMOXIL) 875 MG tablet Take 1 tablet (875 mg total) by mouth 2 (two) times daily. 01/09/19   [place], Modena Nunnery, MD  amphetamine-dextroamphetamine (ADDERALL) 30 MG tablet Take 30 mg by mouth daily.    [provider]  cetirizine (ZYRTEC) 10 MG tablet Take 1 tablet (10 mg total) by mouth every other day. 03/22/18   Alycia Rossetti, MD  clonazePAM Bobbye [place]) 1 MG tablet  09/04/14   [provider]  estrogens, conjugated, (PREMARIN) 0.625 MG tablet Take 1 tablet (0.625 mg total) by mouth daily. 03/22/18   Alycia Rossetti, MD  HYDROcodone-acetaminophen (NORCO/VICODIN) 5-325 MG tablet TAKE 1 TABLET BY MOUTH EVERY 6 HOURS AS NEEDED FOR MODERATE PAIN 10/28/18   Alycia Rossetti, MD  ibuprofen (ADVIL,MOTRIN) 600 MG tablet Take 1 tablet (600 mg total) by mouth every 6 (six) hours as needed. 10/05/18   Alycia Rossetti, MD  ipratropium (ATROVENT) 0.03 % nasal spray Place 2 sprays into both nostrils every 12 (twelve) hours. 06/01/17   , Modena Nunnery, MD  ondansetron (ZOFRAN) 4 MG tablet TAKE 1 TABLET BY MOUTH EVERY 8 HOURS AS NEEDED FOR NAUSEA OR VOMITING. 10/11/18   Alycia Rossetti, MD  topiramate (TOPAMAX) 25 MG capsule Take 1 capsule (25 mg total) by mouth at bedtime. 12/26/18   Alycia Rossetti, MD  traZODone (DESYREL) 50 MG tablet 0.5- 1 tab PO Q HS PRN- sleep. 03/22/18   Alycia Rossetti, MD    Allergies  Allergen Reactions  .  Ciprofloxacin Hcl   . Clindamycin/Lincomycin   . Ppd [Tuberculin Purified Protein Derivative]   . Sulfa Antibiotics Nausea And Vomiting and Rash    Review of Systems  Constitutional: Negative.   HENT: Negative.   Eyes: Negative.   Respiratory: Negative.   Cardiovascular: Negative.   Gastrointestinal: Negative.   Endocrine: Negative.   Genitourinary: Negative.   Musculoskeletal: Negative.   Skin: Negative.   Allergic/Immunologic: Negative.   Neurological: Negative.   Hematological: Negative.    Psychiatric/Behavioral: Negative.   All other systems reviewed and are negative.      Objective:    There were no vitals filed for this visit.  Temp 100.4 Weight 148 lbs and height 5\' 4"  HR 50 - per pt report "but I may be higher than that, I'm just laying down"    Physical Exam   limited PE due to telephone encounter She reports ttp to frontal and max sinuses b/l Phonation congested sounded and nasally Only few times coughing while on the phone No audible wheeze or stridor Able to speak in complete sentences Alert and oriented x 3, answering questions appropriately     Assessment & Plan:   Janet [name] was evaluated via telephone encounter today, on 02/02/19 for the symptoms described in the history of present illness.  She  was evaluated in the context of the global COVID-19 pandemic, which necessitated consideration that the patient might be at risk for infection with the SARS-CoV-2 virus that causes COVID-19.  Institutional protocols and algorithms that pertain to the evaluation of patients at risk for COVID-19 are in a state of rapid change based on information released by regulatory bodies including the CDC and federal and state organizations.  These policies and algorithms were followed during the patient's care here today. Presumed + and under investigation, pts info sent to community testing pool.  No access right now to internet so close f/up with clinic.    ICD-10-CM   1. Upper respiratory tract infection, unspecified type  J06.9   2. Cough  R05   3. Suspected Covid-19 Virus Infection  R68.89    send for testing, isolation discussed, cannot return to work until tested and evaluated (UC f/up and ER if needed)    With recent sinus infection and a reoccurance, and respiratory sx, will start tx for possible bacterial sinusitis and CAP with doxy, encouraged cough suppressants, mucinex, daily antihistamine and better adherence to rhinitis tx and steroid nasal  sprays, as well as other supportive and sx tx.  Current smoker - on chart and per pt report asbestos exposure and "latent TB" ?  Last CXR negative  OOW for now for 7-10 d, may be able to return sooner with a reevaluation.   I discussed the assessment and treatment plan with the patient. The patient was provided an opportunity to ask questions and all were answered. The patient agreed with the plan and demonstrated an understanding of the instructions.   The patient was advised to call back or seek an in-person evaluation if the symptoms worsen or if the condition fails to improve as anticipated.  Phone call concluded at 2:57 PM  I provided 18 minutes of non-face-to-face time during this encounter.  Danelle BerryLeisa Alayna Mabe, PA-C 02/02/19 2:39 PM

## 2019-02-02 NOTE — Telephone Encounter (Signed)
Pt has been scheduled for covid testing.  Scheduled appt with pt directly. Pt was referred by: Delsa Grana, PA-C

## 2019-02-03 ENCOUNTER — Other Ambulatory Visit: Payer: Self-pay

## 2019-02-03 DIAGNOSIS — Z20822 Contact with and (suspected) exposure to covid-19: Secondary | ICD-10-CM

## 2019-02-08 ENCOUNTER — Telehealth: Payer: Self-pay | Admitting: *Deleted

## 2019-02-08 MED ORDER — TOPIRAMATE 50 MG PO TABS: 50.0000 mg | ORAL_TABLET | Freq: Every day | ORAL | 3 refills | Status: DC

## 2019-02-08 NOTE — Telephone Encounter (Signed)
Okay to send 50mg  tablet

## 2019-02-08 NOTE — Telephone Encounter (Signed)
Prescription sent to pharmacy.

## 2019-02-08 NOTE — Telephone Encounter (Signed)
Received call from patient.   States that she discussed increasing Topamax from 25mg  to 50mg . Requested prescription for 50mg .   Ok to refill?

## 2019-02-10 LAB — NOVEL CORONAVIRUS, NAA: SARS-CoV-2, NAA: NOT DETECTED

## 2019-02-13 ENCOUNTER — Telehealth: Payer: Self-pay | Admitting: *Deleted

## 2019-02-13 MED ORDER — BENZONATATE 200 MG PO CAPS: 200.0000 mg | ORAL_CAPSULE | Freq: Three times a day (TID) | ORAL | 1 refills | Status: DC | PRN

## 2019-02-13 MED ORDER — PREDNISONE 20 MG PO TABS: 40.0000 mg | ORAL_TABLET | Freq: Every day | ORAL | 0 refills | Status: AC

## 2019-02-13 NOTE — Telephone Encounter (Signed)
Could be allergic rhinosinusitis secondary to fungus from the mold.  We could try prednisone 40 mg a day for 10 days.  If no better I would recommend a CT scan of the sinuses

## 2019-02-13 NOTE — Telephone Encounter (Signed)
Received call from patient [phone]~ telephone.   Reports that she received COVID results on 02/10/2019 and noted negative. States that she continues to have increased sinus pressure, chest congestion, fever, and malaise. Reports that she has nonproductive cough.   Patient has completed Amoxicillin and doxycycline. Does state that she has been in the process of moving. States that mobile home she was living in did have mold.   Requested MD to advise.

## 2019-02-13 NOTE — Telephone Encounter (Signed)
Call placed to patient and patient made aware.   Agreeable to plan.   Prescription sent to pharmacy.  

## 2019-02-20 ENCOUNTER — Telehealth: Payer: Self-pay | Admitting: *Deleted

## 2019-02-20 NOTE — Telephone Encounter (Signed)
Pt needs to go get Chest xray, The best way even though she has had negative covid 2 weeks ago, is through ER if she has fever/body aches . Recommend she go to ER

## 2019-02-20 NOTE — Telephone Encounter (Signed)
Call placed to patient and patient made aware.   States that she will go to ER in AM. States that if SOB worsens, she will go to ER then.

## 2019-02-20 NOTE — Telephone Encounter (Signed)
Call placed to patient. LMTRC.  

## 2019-02-20 NOTE — Telephone Encounter (Signed)
Received call from patient.   Reports that she continues to have Sx. States that she is having cough, chest congestion, SOB, fever (100- 101), fatigue, and body aches.   Reports that she had to decrease Prednisone to 20mg  daily as she was not able to sleep at all on 40mg  daily.   COIVD testing noted negative on 02/10/2019. Patient has completed amox and doxy in June.   MD please advise.

## 2019-02-21 ENCOUNTER — Ambulatory Visit: Payer: Self-pay | Admitting: Family Medicine

## 2019-02-22 ENCOUNTER — Emergency Department (HOSPITAL_COMMUNITY): Payer: BLUE CROSS/BLUE SHIELD

## 2019-02-22 ENCOUNTER — Emergency Department (HOSPITAL_COMMUNITY)
Admission: EM | Admit: 2019-02-22 | Discharge: 2019-02-22 | Disposition: A | Discharge status: Home / Self Care | Payer: BLUE CROSS/BLUE SHIELD | Attending: Emergency Medicine | Admitting: Emergency Medicine

## 2019-02-22 ENCOUNTER — Encounter (HOSPITAL_COMMUNITY): Payer: Self-pay | Admitting: *Deleted

## 2019-02-22 ENCOUNTER — Other Ambulatory Visit: Payer: Self-pay

## 2019-02-22 DIAGNOSIS — R059 Cough, unspecified: Secondary | ICD-10-CM

## 2019-02-22 DIAGNOSIS — R509 Fever, unspecified: Secondary | ICD-10-CM | POA: Insufficient documentation

## 2019-02-22 DIAGNOSIS — Z20828 Contact with and (suspected) exposure to other viral communicable diseases: Secondary | ICD-10-CM | POA: Insufficient documentation

## 2019-02-22 DIAGNOSIS — R0602 Shortness of breath: Secondary | ICD-10-CM | POA: Insufficient documentation

## 2019-02-22 DIAGNOSIS — Z79899 Other long term (current) drug therapy: Secondary | ICD-10-CM | POA: Insufficient documentation

## 2019-02-22 DIAGNOSIS — J3489 Other specified disorders of nose and nasal sinuses: Secondary | ICD-10-CM | POA: Insufficient documentation

## 2019-02-22 DIAGNOSIS — R05 Cough: Secondary | ICD-10-CM | POA: Insufficient documentation

## 2019-02-22 LAB — CBC
HCT: 41.9 % (ref 36.0–46.0)
Hemoglobin: 13.7 g/dL (ref 12.0–15.0)
MCH: 30.6 pg (ref 26.0–34.0)
MCHC: 32.7 g/dL (ref 30.0–36.0)
MCV: 93.7 fL (ref 80.0–100.0)
Platelets: 297 10*3/uL (ref 150–400)
RBC: 4.47 MIL/uL (ref 3.87–5.11)
RDW: 13.2 % (ref 11.5–15.5)
WBC: 12.2 10*3/uL — ABNORMAL HIGH (ref 4.0–10.5)
nRBC: 0 % (ref 0.0–0.2)

## 2019-02-22 LAB — MONONUCLEOSIS SCREEN: Mono Screen: NEGATIVE

## 2019-02-22 LAB — TROPONIN I (HIGH SENSITIVITY): Troponin I (High Sensitivity): <2 ng/L (ref <18)

## 2019-02-22 LAB — BASIC METABOLIC PANEL
Anion gap: 13 (ref 5–15)
BUN: 17 mg/dL (ref 6–20)
CO2: 20 mmol/L — ABNORMAL LOW (ref 22–32)
Calcium: 9.1 mg/dL (ref 8.9–10.3)
Chloride: 105 mmol/L (ref 98–111)
Creatinine, Ser: 0.82 mg/dL (ref 0.44–1.00)
GFR calc Af Amer: >60 mL/min (ref >60)
GFR calc non Af Amer: >60 mL/min (ref >60)
Glucose, Bld: 89 mg/dL (ref 70–99)
Potassium: 3.9 mmol/L (ref 3.5–5.1)
Sodium: 138 mmol/L (ref 135–145)

## 2019-02-22 MED ORDER — SODIUM CHLORIDE 0.9% FLUSH: 3.0000 mL | Freq: Once | INTRAVENOUS | Status: DC

## 2019-02-22 MED ORDER — IOHEXOL 300 MG/ML  SOLN
75.0000 mL | Freq: Once | INTRAMUSCULAR | Status: AC | PRN
  Administered 2019-02-22: 75 mL via INTRAVENOUS

## 2019-02-22 MED ORDER — ACETAMINOPHEN 500 MG PO TABS
1000.0000 mg | ORAL_TABLET | Freq: Once | ORAL | Status: AC
  Administered 2019-02-22: 1000 mg via ORAL
  Filled 2019-02-22: qty 2

## 2019-02-22 NOTE — Discharge Instructions (Signed)
Your work-up today is very reassuring, CT of your sinuses shows that they are clear without continued signs of infection your chest x-ray was clear and lab work looks good.  Please continue taking steroids as directed by your PCP and using albuterol inhaler as needed.  It looks as though things are starting to clear up from your work-up today.  Please call to schedule close follow-up with your primary care doctor.  Return for worsening fever, new or worsening shortness of breath, chest pain or any other new or concerning symptoms.

## 2019-02-22 NOTE — ED Provider Notes (Signed)
[facility] Provider Note   CSN: [id number] Arrival date & time: 02/22/19  7169    History   Chief Complaint Chief Complaint  Patient presents with   Fever   Shortness of Breath   Facial Pain    HPI Janet [name] is a 43 y.o. female.     Janet [name] is a 43 y.o. female with history of migraines, insomnia, allergic rhinitis and recurrent sinusitis, who presents to the emergency department for evaluation of intermittent fevers, shortness of breath, cough and sinus pain.  Patient reports that she has been sick intermittently over the last 3-1/2 months.  She reports that all started with sinus infections.  Patient has been on 2 courses of amoxicillin and a course of doxycycline which she completed a little over a week ago, she continued to report nasal congestion and sinus discomfort her PCP stopped antibiotics as these did not seem to be improving her symptoms and started the patient on steroids and albuterol.  She is also had some intermittent shortness of breath which she feels has been a bit worse over the past few days.  Patient has had intermittent fevers over the last several months, last week T-max of 102, this week fever has not been over 100.0.  She reports some burning chest discomfort that is worse with cough, certain movements and deep breathing.  This seems to be improved with albuterol.  She was concerned that her symptoms were persisting called her PCP and was directed to come to the emergency department to get chest x-ray and possible sinus imaging given persisting symptoms.  She had a coronavirus test on 6/26 that was negative, no known sick contacts but she continues to have persistent illness.  Reports she feels fatigued.  No history of ACS.  Burning chest discomfort does not radiate and is not exertional.  She has no prior history of PE or DVT, no lower extremity swelling or pain, no recent travels or surgeries.  No other  aggravating or alleviating factors.     Past Medical History:  Diagnosis Date   Migraines    Sinusitis     Patient Active Problem List   Diagnosis Date Noted   Migraines 01/10/2019   Chronic insomnia 03/23/2018   Allergic rhinitis 03/22/2018   Tremor 04/01/2015   Post menopausal syndrome 09/10/2014   Hot flashes 09/10/2014    Past Surgical History:  Procedure Laterality Date   ABDOMINAL HYSTERECTOMY     Age 20    NASAL SEPTUM SURGERY     septum plasy [place]     OB History   No obstetric history on file.      Home Medications    Prior to Admission medications   Medication Sig Start Date End Date Taking? Authorizing Provider  acetaminophen (TYLENOL) 500 MG tablet Take 500 mg by mouth every 6 (six) hours as needed for mild pain.   Yes [provider]  albuterol (VENTOLIN HFA) 108 (90 Base) MCG/ACT inhaler Inhale 2 puffs into the lungs every 4 (four) hours as needed for wheezing or shortness of breath. 02/02/19  Yes Tapia, Leisa, PA-C  amphetamine-dextroamphetamine (ADDERALL) 30 MG tablet Take 30 mg by mouth daily.   Yes [provider]  benzonatate (TESSALON) 200 MG capsule Take 1 capsule (200 mg total) by mouth 3 (three) times daily as needed for cough. 02/13/19  Yes [place], Modena Nunnery, MD  cetirizine (ZYRTEC) 10 MG tablet Take 1 tablet (10 mg total) by mouth  every other day. 03/22/18  Yes [place], Velna HatchetKawanta F, MD  clonazePAM (KLONOPIN) 1 MG tablet Take 1 mg by mouth 3 (three) times daily as needed for anxiety.  09/04/14  Yes [provider]  diphenhydrAMINE (BENADRYL) 25 mg capsule Take 25 mg by mouth every 6 (six) hours as needed for allergies.   Yes [provider]  estrogens, conjugated, (PREMARIN) 0.625 MG tablet Take 1 tablet (0.625 mg total) by mouth daily. 03/22/18  Yes [place], Velna HatchetKawanta F, MD  ibuprofen (ADVIL) 200 MG tablet Take 400 mg by mouth every 6 (six) hours as needed for moderate pain.   Yes [provider]    ipratropium (ATROVENT) 0.03 % nasal spray Place 2 sprays into both nostrils every 12 (twelve) hours. 06/01/17  Yes [place], Velna HatchetKawanta F, MD  ondansetron (ZOFRAN) 4 MG tablet TAKE 1 TABLET BY MOUTH EVERY 8 HOURS AS NEEDED FOR NAUSEA OR VOMITING. Patient taking differently: Take 4 mg by mouth every 8 (eight) hours as needed for nausea or vomiting.  10/11/18  Yes [place], Velna HatchetKawanta F, MD  predniSONE (DELTASONE) 20 MG tablet Take 2 tablets (40 mg total) by mouth daily with breakfast for 10 days. 02/13/19 02/23/19 Yes Donita BrooksPickard, Warren T, MD  topiramate (TOPAMAX) 50 MG tablet Take 1 tablet (50 mg total) by mouth daily. 02/08/19  Yes [place], Velna HatchetKawanta F, MD  traZODone (DESYREL) 50 MG tablet 0.5- 1 tab PO Q HS PRN- sleep. Patient taking differently: Take 25-50 mg by mouth at bedtime as needed for sleep. 0.5- 1 tab PO Q HS PRN- sleep. 03/22/18  Yes [place], Velna HatchetKawanta F, MD  amoxicillin (AMOXIL) 875 MG tablet Take 1 tablet (875 mg total) by mouth 2 (two) times daily. Patient not taking: Reported on 02/22/2019 01/09/19   [place] Scarleturham, Kawanta F, MD  HYDROcodone-acetaminophen (NORCO/VICODIN) 5-325 MG tablet TAKE 1 TABLET BY MOUTH EVERY 6 HOURS AS NEEDED FOR MODERATE PAIN Patient not taking: Reported on 02/22/2019 10/28/18   [place] Scarleturham, Kawanta F, MD  ibuprofen (ADVIL,MOTRIN) 600 MG tablet Take 1 tablet (600 mg total) by mouth every 6 (six) hours as needed. Patient not taking: Reported on 02/22/2019 10/05/18   [place] Scarleturham, Kawanta F, MD    Family History No family history on file.  Social History Social History   Tobacco Use   Smoking status: Current Some Day Smoker   Smokeless tobacco: Never Used  Substance Use Topics   Alcohol use: Yes    Alcohol/week: 0.0 standard drinks    Comment: occasionally   Drug use: No     Allergies   Ciprofloxacin hcl, Clindamycin/lincomycin, Diclofenac, Ppd [tuberculin purified protein derivative], and Sulfa antibiotics   Review of Systems Review of Systems  Constitutional: Positive for fatigue.  Negative for chills and fever.  HENT: Positive for congestion, postnasal drip, rhinorrhea and sinus pain. Negative for sore throat.   Eyes: Negative for visual disturbance.  Respiratory: Positive for cough and shortness of breath.   Cardiovascular: Negative for chest pain and leg swelling.  Gastrointestinal: Negative for abdominal pain, diarrhea, nausea and vomiting.  Genitourinary: Negative for dysuria and frequency.  Musculoskeletal: Positive for myalgias. Negative for arthralgias and neck pain.  Skin: Negative for color change and rash.  Neurological: Positive for headaches. Negative for dizziness, syncope, weakness, light-headedness and numbness.     Physical Exam Updated Vital Signs BP (!) 139/100 (BP Location: Left Arm)    Pulse (!) 105    Temp 97.6 F (36.4 C) (Oral)    Resp 18    Ht 5\' 4"  (1.626  m)    Wt 67.1 kg    SpO2 100%    BMI 25.40 kg/m   Physical Exam Vitals signs and nursing note reviewed.  Constitutional:      General: She is not in acute distress.    Appearance: She is well-developed and normal weight. She is not ill-appearing or diaphoretic.  HENT:     Head: Normocephalic and atraumatic.     Nose:     Comments: Bilateral nares patent, nasal membranes erythematous with slight clear rhinorrhea, no significant edema.  Patient has severe tenderness across her maxillary sinuses but with no overlying erythema or signs of facial cellulitis.    Mouth/Throat:     Mouth: Mucous membranes are moist.     Pharynx: Oropharynx is clear.     Comments: Posterior oropharynx clear and mucous membranes moist, there is mild erythema but no edema or tonsillar exudates, uvula midline, normal phonation, no trismus, tolerating secretions without difficulty. Eyes:     General:        Right eye: No discharge.        Left eye: No discharge.     Conjunctiva/sclera: Conjunctivae normal.     Pupils: Pupils are equal, round, and reactive to light.  Neck:     Musculoskeletal: Neck supple.    Cardiovascular:     Rate and Rhythm: Normal rate and regular rhythm.     Heart sounds: Normal heart sounds. No murmur. No friction rub. No gallop.   Pulmonary:     Effort: Pulmonary effort is normal. No respiratory distress.     Breath sounds: Normal breath sounds. No wheezing or rales.     Comments: Respirations equal and unlabored, patient able to speak in full sentences, lungs clear to auscultation bilaterally Abdominal:     General: Bowel sounds are normal. There is no distension.     Palpations: Abdomen is soft. There is no mass.     Tenderness: There is no abdominal tenderness. There is no guarding.     Comments: Abdomen soft, nondistended, nontender to palpation in all quadrants without guarding or peritoneal signs  Musculoskeletal:        General: No deformity.  Skin:    General: Skin is warm and dry.     Capillary Refill: Capillary refill takes less than 2 seconds.  Neurological:     Mental Status: She is alert.     Coordination: Coordination normal.     Comments: Speech is clear, able to follow commands Moves extremities without ataxia, coordination intact  Psychiatric:        Mood and Affect: Mood normal.        Behavior: Behavior normal.      ED Treatments / Results  Labs (all labs ordered are listed, but only abnormal results are displayed) Labs Reviewed  BASIC METABOLIC PANEL - Abnormal; Notable for the following components:      Result Value   CO2 20 (*)    All other components within normal limits  CBC - Abnormal; Notable for the following components:   WBC 12.2 (*)    All other components within normal limits  NOVEL CORONAVIRUS, NAA (HOSPITAL ORDER, SEND-OUT TO REF LAB)  MONONUCLEOSIS SCREEN  TROPONIN I (HIGH SENSITIVITY)    EKG EKG Interpretation  Date/Time:  Wednesday February 22 2019 10:06:54 EDT Ventricular Rate:  100 PR Interval:  124 QRS Duration: 80 QT Interval:  352 QTC Calculation: 454 R Axis:   71 Text Interpretation:  Normal sinus  rhythm Normal ECG  since last tracing no significant change Confirmed by Eber HongMiller, Brian ([id number]) on 02/22/2019 10:37:14 AM   Radiology Ct Maxillofacial W Contrast  Result Date: 02/22/2019 CLINICAL DATA:  Sinusitis. EXAM: CT MAXILLOFACIAL WITH CONTRAST TECHNIQUE: Multidetector CT images of the paranasal sinuses was performed according to the standard protocol following intravenous contrast administration. CONTRAST:  75mL OMNIPAQUE IOHEXOL 300 MG/ML  SOLN COMPARISON:  06/23/2012 FINDINGS: Paranasal sinuses: Frontal: Hypoplastic right frontal sinus. Clear left frontal sinus with patent drainage pathway. Ethmoid: Normally aerated. Maxillary: Normally aerated. Sphenoid: Normally aerated. Patent sphenoethmoidal recesses. Right ostiomeatal unit: Patent. Left ostiomeatal unit: Patent maxillary antrostomy. Nasal passages: Patent. Prior left uncinectomy and partial middle turbinectomy. 3 mm rightward deviation of an intact nasal septum. Anatomy: No pneumatization superior to anterior ethmoid notches. Keros II. Sellar sphenoid pneumatization pattern. No dehiscence of carotid or optic canals. No onodi cell. Other: Clear mastoid air cells and tympanic cavities. Unremarkable appearance of the orbits and included portion of the brain. IMPRESSION: 1. Clear sinuses. Patent left maxillary antrostomy. 2. Rightward nasal septal deviation. Electronically Signed   By: Sebastian AcheAllen  Grady M.D.   On: 02/22/2019 14:09   Dg Chest Portable 1 View  Result Date: 02/22/2019 CLINICAL DATA:  Shortness of breath and fevers, previous negative COVID-19 test EXAM: PORTABLE CHEST 1 VIEW COMPARISON:  06/04/2017 FINDINGS: The heart size and mediastinal contours are within normal limits. Both lungs are clear. The visualized skeletal structures are unremarkable. IMPRESSION: No active disease. Electronically Signed   By: Alcide CleverMark  Lukens M.D.   On: 02/22/2019 11:11    Procedures Procedures (including critical care time)  Medications Ordered in  ED Medications  acetaminophen (TYLENOL) tablet 1,000 mg (1,000 mg Oral Given 02/22/19 1212)  iohexol (OMNIPAQUE) 300 MG/ML solution 75 mL (75 mLs Intravenous Contrast Given 02/22/19 1346)     Initial Impression / Assessment and Plan / ED Course  I have reviewed the triage vital signs and the nursing notes.  Pertinent labs & imaging results that were available during my care of the patient were reviewed by me and considered in my medical decision making (see chart for details).  Patient presents with intermittent fevers, cough, recurrent sinus infections and shortness of breath.  She has been on multiple courses of antibiotics without improvement, now on steroids with continued symptoms.  On arrival patient is afebrile, and has not taken any fever reducers prior to arrival.  Mildly tachycardic at 105 but this improved without intervention, mildly hypertensive.  Breathing comfortably and satting well on room air.  Reports that she has had intermittent shortness of breath and a burning central chest discomfort with cough and deep breathing.  Fevers last week, low-grade fevers this week.  Previous negative coronavirus test but continued symptoms.  Patient with some erythema of nasal mucosa but no obvious drainage although she continues to have tenderness across both maxillary sinuses and frontal sinus.  Lungs are clear to auscultation overall.  Will check basic labs, EKG, troponin, send out coronavirus test, mono test and given persistent sinus infections not improving despite multiple antibiotics will check CT maxillofacial to get better look at sinuses to see if there are still signs of infection.  Patient reports she was living in a home with mold and could be at risk for fungal sinusitis.  Labs thus far very reassuring, slight leukocytosis of 12.2, normal hemoglobin, no acute electrolyte derangements requiring intervention and normal renal function.  High-sensitivity troponin is less than 2 and symptoms  have been present for greater than 6 hours do  not feel that additional troponin is indicated, EKG shows sinus rhythm without concerning changes.  Highly doubt PE given prolonged course of symptoms without significant worsening, patient is PERC negative.  Mono test is negative.  Chest x-ray shows no evidence of pneumonia or other active cardiopulmonary disease.  CT shows no evidence of sinusitis, both sinuses are patent without evidence of inflammation and patient's previous and antrostomy is patent as well.  Work-up today is very reassuring and suggest that patient symptoms are likely improving.  We will have her continue course of steroids prescribed by her primary care doctor, albuterol as needed and will have her follow-up closely with PCP.  Return precautions discussed.  Patient expresses understanding and agreement with plan.  Discharged home in good condition.  Final Clinical Impressions(s) / ED Diagnoses   Final diagnoses:  Shortness of breath  Cough  Sinus pain    ED Discharge Orders    None       Dartha LodgeFord, Reynald Woods N, [place] JerseyPA-C 02/23/19 [id number]    Eber HongMiller, Brian, MD 02/26/19 [phone]

## 2019-02-22 NOTE — ED Triage Notes (Signed)
Pt c/o sob and tested for covid on 6/26 that was neg.  States last had fever 3 days ago. C/o chest pain, weakness, sob.

## 2019-02-23 LAB — NOVEL CORONAVIRUS, NAA (HOSP ORDER, SEND-OUT TO REF LAB; TAT 18-24 HRS): SARS-CoV-2, NAA: NOT DETECTED

## 2019-03-07 ENCOUNTER — Telehealth: Payer: Self-pay | Admitting: *Deleted

## 2019-03-07 NOTE — Telephone Encounter (Signed)
Received call from patient.   Reports that she did go to ER on 02/22/2019. Reports that she is feeling better and her breathing is back to normal.   States that hs has not received results from COVID testing at this time. States that she did return to work on 03/06/2019 for a half day.   Requested letter to release her back to work.   Noted COVID testing is negative.   MD please advise.

## 2019-03-07 NOTE — Telephone Encounter (Signed)
Okay to give note to return back to work

## 2019-03-08 NOTE — Telephone Encounter (Signed)
Letter transcribed.   Call placed to patient. LMTRC.  

## 2019-03-09 NOTE — Telephone Encounter (Signed)
Call placed to patient and patient made aware per VM.  

## 2019-03-24 ENCOUNTER — Other Ambulatory Visit: Payer: Self-pay | Admitting: Family Medicine

## 2019-06-02 ENCOUNTER — Other Ambulatory Visit: Payer: Self-pay | Admitting: *Deleted

## 2019-06-02 ENCOUNTER — Telehealth: Payer: Self-pay | Admitting: *Deleted

## 2019-06-02 DIAGNOSIS — Z111 Encounter for screening for respiratory tuberculosis: Secondary | ICD-10-CM

## 2019-06-02 NOTE — Telephone Encounter (Signed)
Received call from patient.   Reports that she is starting a new job and requires TB screening.   States that she cannot use PPD, but did have CXR in July during her illness. Inquired as to if she can use this CXR result, or will she need another TB Gold.  MD please advise.

## 2019-06-02 NOTE — Telephone Encounter (Signed)
Recommend TB gold be done

## 2019-06-02 NOTE — Telephone Encounter (Signed)
LM on VM with recommendations.

## 2019-06-14 NOTE — Telephone Encounter (Signed)
Okay to order CXR, DX Tb screening

## 2019-06-14 NOTE — Addendum Note (Signed)
Addended by: Sheral Flow on: 06/14/2019 12:27 PM   Modules accepted: Orders

## 2019-06-14 NOTE — Telephone Encounter (Signed)
Received call from patient.   Reports that her new employer will not accept the TB Gold. States that she must have the CXR.   Ok to order?

## 2019-06-14 NOTE — Telephone Encounter (Signed)
CXR ordered.   Call placed to patient and patient made aware via VM.

## 2019-06-30 ENCOUNTER — Other Ambulatory Visit: Payer: Self-pay | Admitting: *Deleted

## 2019-06-30 MED ORDER — ESTROGENS CONJUGATED 0.625 MG PO TABS: 0.6250 mg | ORAL_TABLET | Freq: Every day | ORAL | 2 refills | Status: DC

## 2019-07-10 ENCOUNTER — Other Ambulatory Visit: Payer: Self-pay | Admitting: *Deleted

## 2019-07-10 ENCOUNTER — Telehealth: Payer: Self-pay | Admitting: *Deleted

## 2019-07-10 MED ORDER — FLUCONAZOLE 150 MG PO TABS: 150.0000 mg | ORAL_TABLET | Freq: Once | ORAL | 0 refills | Status: AC

## 2019-07-10 MED ORDER — AMOXICILLIN 500 MG PO CAPS: 500.0000 mg | ORAL_CAPSULE | Freq: Three times a day (TID) | ORAL | 0 refills | Status: DC

## 2019-07-10 MED ORDER — ESTROGENS CONJUGATED 0.625 MG PO TABS: 0.6250 mg | ORAL_TABLET | Freq: Every day | ORAL | 2 refills | Status: DC

## 2019-07-10 NOTE — Telephone Encounter (Signed)
I would recommend Covid testing She can use mucinex, tylenol for fever pain or ibuprofen Nasal saline/ nasal steroid  Oral anti-histamine Okay to send amoxicillin 500mg  TID for 10 days, this will be cheapest option out of pocket

## 2019-07-10 NOTE — Telephone Encounter (Signed)
Received call from patient.   States that she has Sx of sinus infection. Reports that she does not have insurance and therefore cannot afford visit.   Reports Sx began on WEed, 07/05/2019. Reports fever (99.9- 100.1), sinus pressure, R ear pressure, lymph nodes swollen in throat, sore throat, nasal drainage, and HA.   Advised that Sx closely resemble COVID Sx and recommended testing. Agreeable to plan.   States that she has been using OTC sinus medication without relief.   MD please advise.

## 2019-07-10 NOTE — Telephone Encounter (Signed)
Call placed to patient and patient made aware.   Prescription sent to pharmacy.  

## 2019-07-11 ENCOUNTER — Other Ambulatory Visit: Payer: Self-pay

## 2019-07-11 DIAGNOSIS — Z20822 Contact with and (suspected) exposure to covid-19: Secondary | ICD-10-CM

## 2019-07-13 ENCOUNTER — Encounter: Payer: Self-pay | Admitting: Family Medicine

## 2019-07-13 LAB — NOVEL CORONAVIRUS, NAA: SARS-CoV-2, NAA: NOT DETECTED

## 2019-10-19 ENCOUNTER — Other Ambulatory Visit: Payer: Self-pay | Admitting: Family Medicine

## 2019-12-21 ENCOUNTER — Other Ambulatory Visit: Payer: Self-pay | Admitting: *Deleted

## 2019-12-21 ENCOUNTER — Ambulatory Visit (INDEPENDENT_AMBULATORY_CARE_PROVIDER_SITE_OTHER): Payer: BLUE CROSS/BLUE SHIELD | Admitting: Family Medicine

## 2019-12-21 ENCOUNTER — Other Ambulatory Visit: Payer: Self-pay

## 2019-12-21 ENCOUNTER — Encounter: Payer: Self-pay | Admitting: Family Medicine

## 2019-12-21 VITALS — BP 128/70 | HR 94 | Temp 98.1°F | Resp 16 | Ht 62.0 in | Wt 149.0 lb

## 2019-12-21 DIAGNOSIS — R5383 Other fatigue: Secondary | ICD-10-CM | POA: Diagnosis not present

## 2019-12-21 DIAGNOSIS — J0100 Acute maxillary sinusitis, unspecified: Secondary | ICD-10-CM

## 2019-12-21 DIAGNOSIS — N200 Calculus of kidney: Secondary | ICD-10-CM

## 2019-12-21 DIAGNOSIS — J301 Allergic rhinitis due to pollen: Secondary | ICD-10-CM

## 2019-12-21 DIAGNOSIS — R300 Dysuria: Secondary | ICD-10-CM

## 2019-12-21 DIAGNOSIS — N951 Menopausal and female climacteric states: Secondary | ICD-10-CM

## 2019-12-21 DIAGNOSIS — E785 Hyperlipidemia, unspecified: Secondary | ICD-10-CM

## 2019-12-21 DIAGNOSIS — Z1231 Encounter for screening mammogram for malignant neoplasm of breast: Secondary | ICD-10-CM

## 2019-12-21 LAB — URINALYSIS, ROUTINE W REFLEX MICROSCOPIC
Bilirubin Urine: NEGATIVE
Glucose, UA: NEGATIVE
Hyaline Cast: NONE SEEN /LPF
Ketones, ur: NEGATIVE
Leukocytes,Ua: NEGATIVE
Nitrite: NEGATIVE
Protein, ur: NEGATIVE
Specific Gravity, Urine: 1.02 (ref 1.001–1.03)
pH: 6 (ref 5.0–8.0)

## 2019-12-21 LAB — MICROSCOPIC MESSAGE

## 2019-12-21 MED ORDER — HYDROCODONE-ACETAMINOPHEN 5-325 MG PO TABS: 1.0000 | ORAL_TABLET | Freq: Four times a day (QID) | ORAL | 0 refills | Status: DC | PRN

## 2019-12-21 MED ORDER — AMOXICILLIN-POT CLAVULANATE 875-125 MG PO TABS: 1.0000 | ORAL_TABLET | Freq: Two times a day (BID) | ORAL | 0 refills | Status: DC

## 2019-12-21 MED ORDER — FLUCONAZOLE 150 MG PO TABS
150.0000 mg | ORAL_TABLET | Freq: Once | ORAL | 0 refills | Status: AC
Start: 2019-12-21 — End: 2019-12-21

## 2019-12-21 MED ORDER — TAMSULOSIN HCL 0.4 MG PO CAPS: 0.4000 mg | ORAL_CAPSULE | Freq: Every day | ORAL | 0 refills | Status: DC

## 2019-12-21 MED ORDER — MONTELUKAST SODIUM 10 MG PO TABS: 10.0000 mg | ORAL_TABLET | Freq: Every day | ORAL | 3 refills | Status: AC

## 2019-12-21 NOTE — Assessment & Plan Note (Signed)
Treat acute infection but start singulair Continue atrovent

## 2019-12-21 NOTE — Assessment & Plan Note (Signed)
Patient has history of kidney stones rarely gets treatment for that often deals with them at home.  She had old medication left over from a few years ago which she took recently but still has some troubles urinating.  I did discuss the renal to see if she has other smaller stones that may be still causing some issues but she wants to hold off.  Instead I will go ahead and start her on Flomax 0.4 mg once a day for 2 weeks have given her hydrocodone.  She is on Augmentin secondary to sinus infection but may have that there is any underlying infection renal wise as well.  Urinalysis did not suggest urinary tract infection.  Chronic fatigue I think this is multifactorial she is working long hours on her new job often double shifts.  She also has current infection and chronic sinusitis issues which caused her to have some headaches.  Advised her to push more fluids.  Decreased energy drinks if possible

## 2019-12-21 NOTE — Patient Instructions (Signed)
Please schedule your mammogram We will call with lab results Take antibiotics Take flomax for 2 weeks Pain meds as needed Call if not impriving F/U 6 months for Physical

## 2019-12-21 NOTE — Progress Notes (Signed)
Subjective:    Patient ID: Janet [name], female    DOB: 01-09-1976, 44 y.o.   MRN: [id number]  Patient presents for Sinus Infection (x3 weeks- sinus pressure, dizziness, nasal drainage,sweats) and Kidney Stones (passedkidney stone x1 week- still not using BR as she should)   Patient here with sinus pressure drainage headaches for the past few weeks.  She has had low-grade fever and work-up sweats.  She has had chronic sinus infections has history of chronic sinus issues.  She has been using Atrovent nasal spray and also taking Alka-Seltzer sinus and cold.  She is used Excedrin a few times as well.  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  Not had any cough or congestion involving her head.  She also states that she has been very fatigued she is not sure which is a combination of things.  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  She has not had many days off.  She also had another kidney stone which she passed out a week ago.  She still does not feel like she is releasing all of her urine.  She still has some low back discomfort she states this is about her 17th kidney stone it runs in her family.  She often does not come in to have it treated as she has had them for so many years.   No change in bowel movements.   CT sinus  02/22/19 :  IMPRESSION: 1. Clear sinuses. Patent left maxillary antrostomy. 2. Rightward nasal septal deviation.  Review Of Systems:  GEN- denies fatigue, fever, weight loss,weakness, recent illness HEENT- denies eye drainage, change in vision, +nasal discharge, CVS- denies chest pain, palpitations RESP- denies SOB, cough, wheeze ABD- denies N/V, change in stools, abd pain GU- denies dysuria, hematuria, dribbling, incontinence MSK- + joint pain, muscle aches, injury Neuro- +headache, dizziness, syncope, seizure activity       Objective:    BP  128/70   Pulse 94   Temp 98.1 F (36.7 C) (Temporal)   Resp 16   Ht 5\' 2"  (1.575 m)   Wt 149 lb (67.6 kg)   SpO2 99%   BMI 27.25 kg/m  GEN- NAD, alert and oriented x3, fatigued appearing  HEENT- PERRL, EOMI, non injected sclera, pink conjunctiva, MMM, oropharynx clear , TM clear bilat no effusion,  + maxillary sinus tenderness, inflammed turbinates,  Nasal drainage  Neck- Supple, no LAD CVS- RRR, no murmur RESP-CTAB ABD-NABS,soft,NT,TTP suprapubic region, + CVA tenderness, right side, no guarding  EXT- No edema Pulses- Radial 2+          Assessment & Plan:      Problem List Items Addressed This Visit      Unprioritized   Allergic rhinitis    Treat acute infection but start singulair Continue atrovent      Kidney stones    Patient has history of kidney stones rarely gets treatment for that often deals with them at home.  She had old medication left over from a few years ago which she took recently but still has some troubles urinating.  I did discuss the renal to see if she has other smaller stones that may be still causing some issues but she wants to hold off.  Instead I will go ahead and start her on Flomax 0.4 mg once a day for 2 weeks have given her hydrocodone.  She is on Augmentin secondary to sinus infection but may have that there is any underlying infection renal wise as well.  Urinalysis did not suggest urinary tract infection.  Chronic fatigue I think this is multifactorial she is working long hours on her new job often double shifts.  She also has current infection and chronic sinusitis issues which caused her to have some headaches.  Advised her to push more fluids.  Decreased energy drinks if possible      Relevant Medications   HYDROcodone-acetaminophen (NORCO) 5-325 MG tablet   Post menopausal syndrome    discussed importance of mammogram again as she is on estrogen therapy, needs screening to ensure no breast cancer Pt voiced understanding and will  schedule        Other Visit Diagnoses    Dysuria    -  Primary   Relevant Orders   Urinalysis, Routine w reflex microscopic (Completed)   Acute maxillary sinusitis, recurrence not specified       Relevant Medications   amoxicillin-clavulanate (AUGMENTIN) 875-125 MG tablet   Fatigue, unspecified type       Relevant Orders   CBC with Differential/Platelet   Comprehensive metabolic panel   TSH   Hyperlipidemia, unspecified hyperlipidemia type       Relevant Orders   Lipid panel      Note: This dictation was prepared with Dragon dictation along with smaller phrase technology. Any transcriptional errors that result from this process are unintentional.

## 2019-12-21 NOTE — Assessment & Plan Note (Signed)
discussed importance of mammogram again as she is on estrogen therapy, needs screening to ensure no breast cancer Pt voiced understanding and will schedule

## 2019-12-22 ENCOUNTER — Other Ambulatory Visit: Payer: Self-pay | Admitting: *Deleted

## 2019-12-22 LAB — LIPID PANEL
Cholesterol: 217 mg/dL — ABNORMAL HIGH (ref <200)
HDL: 57 mg/dL (ref >=50)
LDL Cholesterol (Calc): 105 mg/dL (calc) — ABNORMAL HIGH
Non-HDL Cholesterol (Calc): 160 mg/dL (calc) — ABNORMAL HIGH (ref <130)
Total CHOL/HDL Ratio: 3.8 (calc) (ref <5.0)
Triglycerides: 386 mg/dL — ABNORMAL HIGH (ref <150)

## 2019-12-22 LAB — CBC WITH DIFFERENTIAL/PLATELET
Absolute Monocytes: 542 cells/uL (ref 200–950)
Basophils Absolute: 34 cells/uL (ref 0–200)
Basophils Relative: 0.4 %
Eosinophils Absolute: 189 cells/uL (ref 15–500)
Eosinophils Relative: 2.2 %
HCT: 40.3 % (ref 35.0–45.0)
Hemoglobin: 13.5 g/dL (ref 11.7–15.5)
Lymphs Abs: 2520 cells/uL (ref 850–3900)
MCH: 31.1 pg (ref 27.0–33.0)
MCHC: 33.5 g/dL (ref 32.0–36.0)
MCV: 92.9 fL (ref 80.0–100.0)
MPV: 11.8 fL (ref 7.5–12.5)
Monocytes Relative: 6.3 %
Neutro Abs: 5315 cells/uL (ref 1500–7800)
Neutrophils Relative %: 61.8 %
Platelets: 213 10*3/uL (ref 140–400)
RBC: 4.34 10*6/uL (ref 3.80–5.10)
RDW: 11.8 % (ref 11.0–15.0)
Total Lymphocyte: 29.3 %
WBC: 8.6 10*3/uL (ref 3.8–10.8)

## 2019-12-22 LAB — COMPREHENSIVE METABOLIC PANEL
AG Ratio: 1.9 (calc) (ref 1.0–2.5)
ALT: 15 U/L (ref 6–29)
AST: 15 U/L (ref 10–30)
Albumin: 4.2 g/dL (ref 3.6–5.1)
Alkaline phosphatase (APISO): 50 U/L (ref 31–125)
BUN: 11 mg/dL (ref 7–25)
CO2: 27 mmol/L (ref 20–32)
Calcium: 9.1 mg/dL (ref 8.6–10.2)
Chloride: 102 mmol/L (ref 98–110)
Creat: 0.74 mg/dL (ref 0.50–1.10)
Globulin: 2.2 g/dL (calc) (ref 1.9–3.7)
Glucose, Bld: 90 mg/dL (ref 65–99)
Potassium: 4 mmol/L (ref 3.5–5.3)
Sodium: 139 mmol/L (ref 135–146)
Total Bilirubin: 0.3 mg/dL (ref 0.2–1.2)
Total Protein: 6.4 g/dL (ref 6.1–8.1)

## 2019-12-22 LAB — TSH: TSH: 1.9 mIU/L

## 2019-12-22 MED ORDER — FISH OIL 1000 MG PO CAPS: 1.0000 | ORAL_CAPSULE | Freq: Two times a day (BID) | ORAL | 11 refills | Status: AC

## 2019-12-22 MED ORDER — FENOFIBRATE 48 MG PO TABS: 48.0000 mg | ORAL_TABLET | Freq: Every day | ORAL | 3 refills | Status: AC

## 2019-12-28 ENCOUNTER — Telehealth: Payer: Self-pay | Admitting: *Deleted

## 2019-12-28 ENCOUNTER — Ambulatory Visit (INDEPENDENT_AMBULATORY_CARE_PROVIDER_SITE_OTHER): Payer: BLUE CROSS/BLUE SHIELD | Admitting: Family Medicine

## 2019-12-28 ENCOUNTER — Encounter: Payer: Self-pay | Admitting: Family Medicine

## 2019-12-28 ENCOUNTER — Other Ambulatory Visit: Payer: Self-pay

## 2019-12-28 VITALS — BP 144/100 | HR 99 | Temp 96.8°F | Resp 18 | Ht 64.0 in | Wt 142.0 lb

## 2019-12-28 DIAGNOSIS — G43119 Migraine with aura, intractable, without status migrainosus: Secondary | ICD-10-CM

## 2019-12-28 MED ORDER — KETOROLAC TROMETHAMINE 60 MG/2ML IM SOLN
60.0000 mg | Freq: Once | INTRAMUSCULAR | Status: AC
  Administered 2019-12-28: 60 mg via INTRAMUSCULAR

## 2019-12-28 MED ORDER — PREDNISONE 20 MG PO TABS
ORAL_TABLET | ORAL | 0 refills | Status: DC
Start: 2019-12-28 — End: 2020-08-29

## 2019-12-28 MED ORDER — PROMETHAZINE HCL 25 MG/ML IJ SOLN
25.0000 mg | Freq: Once | INTRAMUSCULAR | Status: AC
  Administered 2019-12-28: 25 mg via INTRAMUSCULAR

## 2019-12-28 MED ORDER — SUMATRIPTAN SUCCINATE 100 MG PO TABS
100.0000 mg | ORAL_TABLET | ORAL | 0 refills | Status: DC | PRN
Start: 2019-12-28 — End: 2020-02-15

## 2019-12-28 NOTE — Progress Notes (Signed)
Subjective:    Patient ID: Janet [name], female    DOB: 01-16-1976, 44 y.o.   MRN: [id number]  HPI Patient is currently under treatment for a sinus infection.  She is on day 5 of Augmentin as well as Flonase.  However today she has developed a severe headache.  Is located in the center of her head.  It is associated with photophobia and photophobia.  She reports scintillating lights and flashing lights in her vision.  She also reports nausea but no vomiting.  The headache is pounding and pulsatile in nature.  It sounds classic for migraine.  She has had a history of migraines however she has not had any in quite some time.  She used to take Topamax for prophylactic however her headaches have been well controlled up until recently.  However this started a few days ago and now has progressed to a serious headache that she is unable to relieve despite taking Excedrin Migraine today and also being on hydrocodone for a kidney stone.  She denies any neurologic deficits. Past Medical History:  Diagnosis Date  . Kidney stones   . Migraines   . Sinusitis    Past Surgical History:  Procedure Laterality Date  . ABDOMINAL HYSTERECTOMY     Age 48   . NASAL SEPTUM SURGERY     septum plasy [place]   Current Outpatient Medications on File Prior to Visit  Medication Sig Dispense Refill  . acetaminophen (TYLENOL) 500 MG tablet Take 500 mg by mouth every 6 (six) hours as needed for mild pain.    Marland Kitchen albuterol (VENTOLIN HFA) 108 (90 Base) MCG/ACT inhaler Inhale 2 puffs into the lungs every 4 (four) hours as needed for wheezing or shortness of breath. 18 g 0  . amoxicillin-clavulanate (AUGMENTIN) 875-125 MG tablet Take 1 tablet by mouth 2 (two) times daily. 20 tablet 0  . amphetamine-dextroamphetamine (ADDERALL) 30 MG tablet Take 30 mg by mouth daily.    . clonazePAM (KLONOPIN) 1 MG tablet Take 1 mg by mouth 3 (three) times daily as needed for anxiety.   2  . diphenhydrAMINE (BENADRYL) 25 mg capsule  Take 25 mg by mouth every 6 (six) hours as needed for allergies.    . fenofibrate (TRICOR) 48 MG tablet Take 1 tablet (48 mg total) by mouth daily. 90 tablet 3  . HYDROcodone-acetaminophen (NORCO) 5-325 MG tablet Take 1 tablet by mouth every 6 (six) hours as needed for moderate pain. 15 tablet 0  . ipratropium (ATROVENT) 0.03 % nasal spray Place 2 sprays into both nostrils every 12 (twelve) hours. 30 mL 12  . montelukast (SINGULAIR) 10 MG tablet Take 1 tablet (10 mg total) by mouth at bedtime. For allergies 30 tablet 3  . Omega-3 Fatty Acids (FISH OIL) 1000 MG CAPS Take 1 capsule (1,000 mg total) by mouth in the morning and at bedtime. 60 capsule 11  . ondansetron (ZOFRAN) 4 MG tablet TAKE 1 TABLET BY MOUTH EVERY 8 HOURS AS NEEDED FOR NAUSEA OR VOMITING. (Patient taking differently: Take 4 mg by mouth every 8 (eight) hours as needed for nausea or vomiting. ) 20 tablet 0  . PREMARIN 0.625 MG tablet TAKE 1 TABLET(0.625 MG) BY MOUTH DAILY FOR 21 DAYS. DO NOT TAKE FOR 7 DAYS 30 tablet 2  . tamsulosin (FLOMAX) 0.4 MG CAPS capsule Take 1 capsule (0.4 mg total) by mouth daily. 30 capsule 0   No current facility-administered medications on file prior to visit.   Allergies  Allergen  Reactions  . Ciprofloxacin Hcl   . Clindamycin/Lincomycin   . Diclofenac Nausea And Vomiting  . Ppd [Tuberculin Purified Protein Derivative]   . Sulfa Antibiotics Nausea And Vomiting and Rash   Social History   Socioeconomic History  . Marital status: Widowed    Spouse name: Not on file  . Number of children: Not on file  . Years of education: Not on file  . Highest education level: Not on file  Occupational History  . Not on file  Tobacco Use  . Smoking status: Current Some Day Smoker  . Smokeless tobacco: Never Used  Substance and Sexual Activity  . Alcohol use: Yes    Alcohol/week: 0.0 standard drinks    Comment: occasionally  . Drug use: No  . Sexual activity: Not Currently  Other Topics Concern  . Not  on file  Social History Narrative  . Not on file   Social Determinants of Health   Financial Resource Strain:   . Difficulty of Paying Living Expenses:   Food Insecurity:   . Worried About Programme researcher, broadcasting/film/video in the Last Year:   . Barista in the Last Year:   Transportation Needs:   . Freight forwarder (Medical):   Marland Kitchen Lack of Transportation (Non-Medical):   Physical Activity:   . Days of Exercise per Week:   . Minutes of Exercise per Session:   Stress:   . Feeling of Stress :   Social Connections:   . Frequency of Communication with Friends and Family:   . Frequency of Social Gatherings with Friends and Family:   . Attends Religious Services:   . Active Member of Clubs or Organizations:   . Attends Banker Meetings:   Marland Kitchen Marital Status:   Intimate Partner Violence:   . Fear of Current or Ex-Partner:   . Emotionally Abused:   Marland Kitchen Physically Abused:   . Sexually Abused:       Review of Systems  All other systems reviewed and are negative.      Objective:   Physical Exam Vitals reviewed.  Constitutional:      General: She is not in acute distress.    Appearance: Normal appearance. She is normal weight. She is not ill-appearing or toxic-appearing.  HENT:     Head: Normocephalic and atraumatic.     Nose: Congestion present. No rhinorrhea.  Neurological:     General: No focal deficit present.     Mental Status: She is alert and oriented to person, place, and time. Mental status is at baseline.     Cranial Nerves: No cranial nerve deficit.     Sensory: No sensory deficit.     Motor: No weakness.     Coordination: Coordination normal.     Gait: Gait normal.     Deep Tendon Reflexes: Reflexes normal.           Assessment & Plan:  Intractable migraine with aura without status migrainosus  Patient was given 60 of Toradol IM and 25 mg of Phenergan IM x1 to break the migraine headache.  She can complete her Augmentin however I will add a  prednisone taper pack as an anti-inflammatory both for her sinuses as well as for the severe migraine that she is having.

## 2019-12-28 NOTE — Addendum Note (Signed)
Addended by: Lynnea Ferrier T on: 12/28/2019 04:34 PM   Modules accepted: Orders

## 2019-12-28 NOTE — Addendum Note (Signed)
Addended by: Legrand Rams B on: 12/28/2019 04:29 PM   Modules accepted: Orders

## 2019-12-28 NOTE — Telephone Encounter (Signed)
Received call from patient.   Patient was seen on 12/11/2019 and given ABTx Augmentin for sinusitis.  States that sinus pressure remains to face and head. States that she noted severe HA on 12/27/2019. States that HA has worsened and she feels she may be having migraine. Reports that OTC medications has not eased HA>   Advised to come to office for evaluation. Appointment scheduled.

## 2020-01-03 ENCOUNTER — Ambulatory Visit: Payer: Self-pay | Admitting: Family Medicine

## 2020-01-03 ENCOUNTER — Encounter: Payer: Self-pay | Admitting: Family Medicine

## 2020-01-18 ENCOUNTER — Other Ambulatory Visit: Payer: Self-pay | Admitting: Family Medicine

## 2020-01-27 ENCOUNTER — Other Ambulatory Visit: Payer: Self-pay | Admitting: Family Medicine

## 2020-02-15 ENCOUNTER — Other Ambulatory Visit: Payer: Self-pay | Admitting: Family Medicine

## 2020-04-02 ENCOUNTER — Other Ambulatory Visit: Payer: Self-pay | Admitting: Family Medicine

## 2020-05-11 IMAGING — CT CT MAXILLOFACIAL WITH CONTRAST
3 of 6 series · 16 of 47 positions shown, 19 images · IV contrast (APPLIED)
Comparison: 06/23/2012

CLINICAL DATA: Sinusitis.

EXAM:
CT MAXILLOFACIAL WITH CONTRAST
TECHNIQUE: Multidetector CT images of the paranasal sinuses was performed
according to the standard protocol following intravenous contrast
administration.
CONTRAST:  75mL OMNIPAQUE IOHEXOL 300 MG/ML  SOLN

[Series 3: maxilllofacial 2.0 hr40 3 · axial · 0.39mm/px · z∈[-216,-84]mm · 11 of 78 slices shown, 14 images]
[im 6/78  brain]
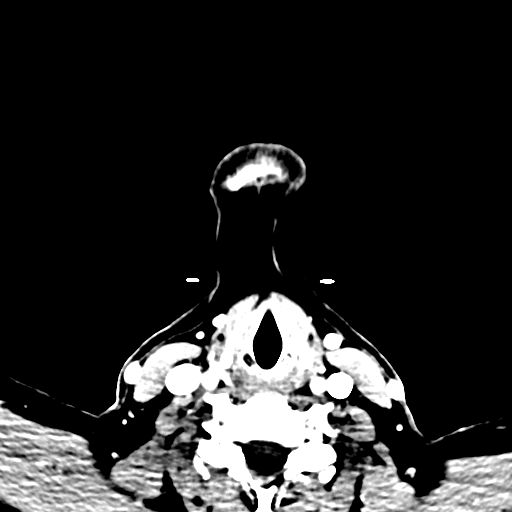
[im 6/78  bone]
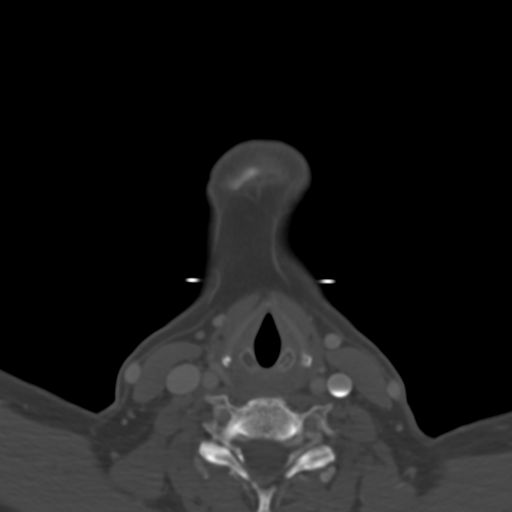
[im 12/78  bone]
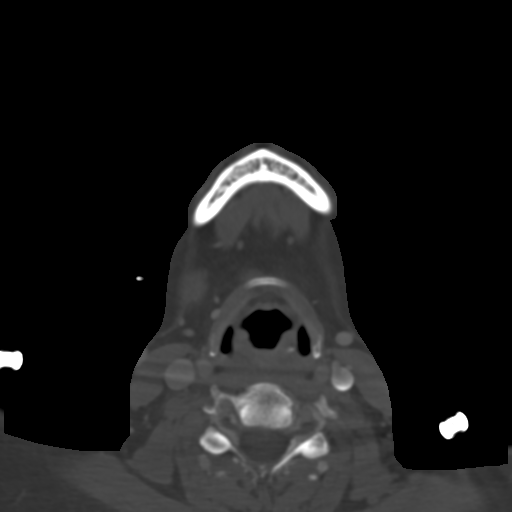
[im 17/78  bone]
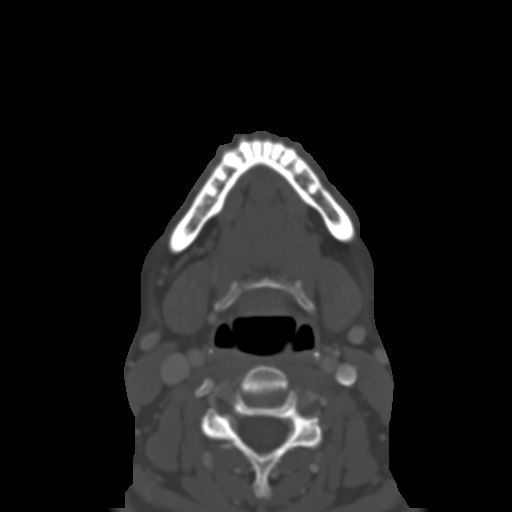
[im 28/78  bone]
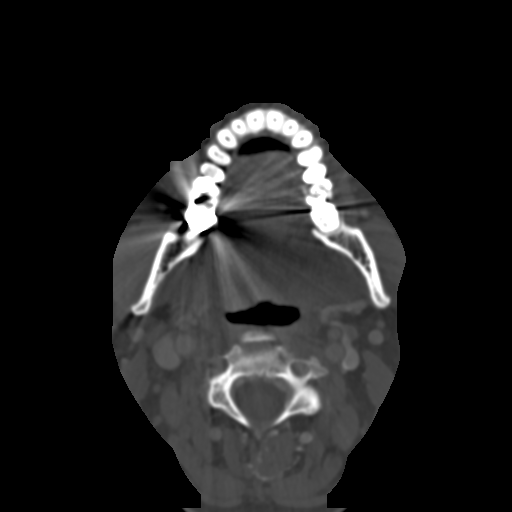
[im 34/78  brain]
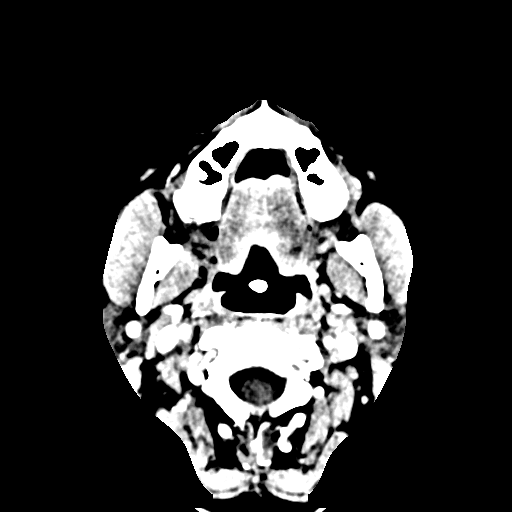
[im 34/78  bone]
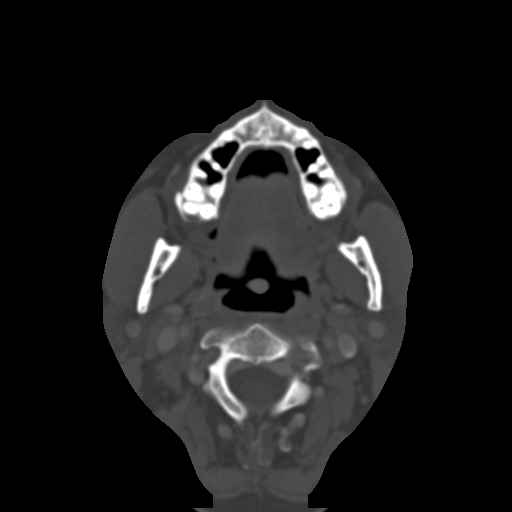
[im 39/78  bone]
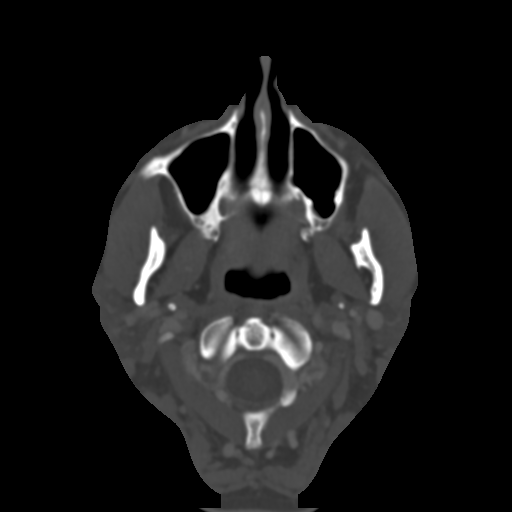
[im 45/78  bone]
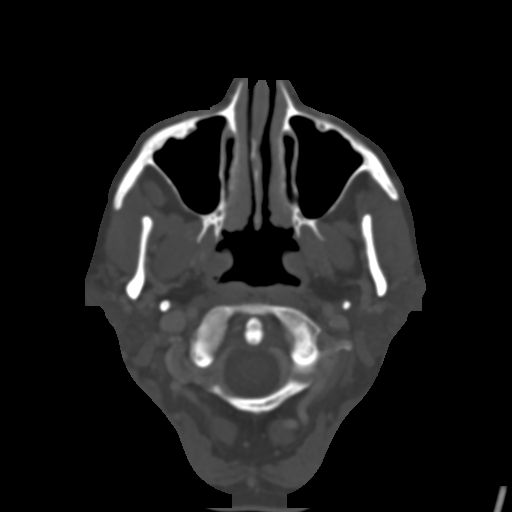
[im 50/78  bone]
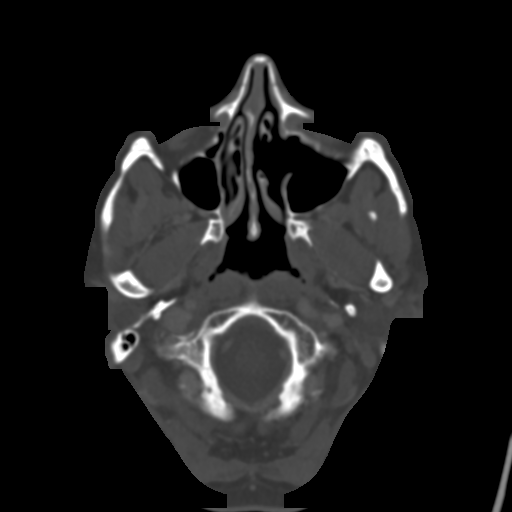
[im 61/78  brain]
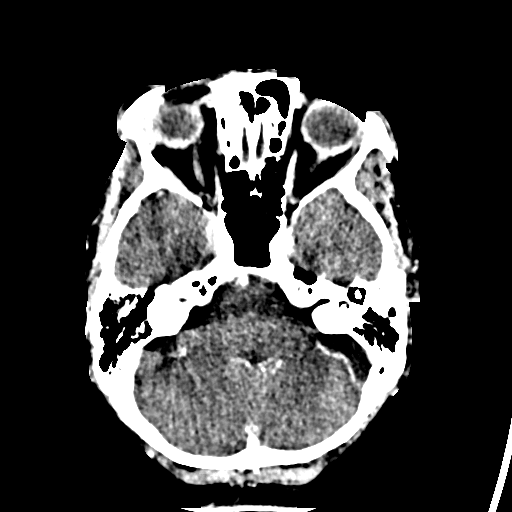
[im 61/78  bone]
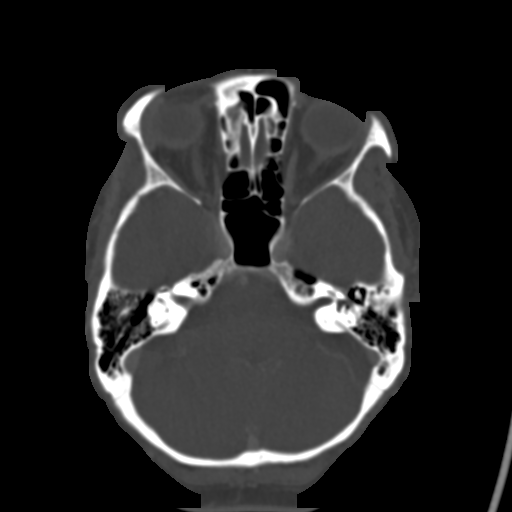
[im 67/78  bone]
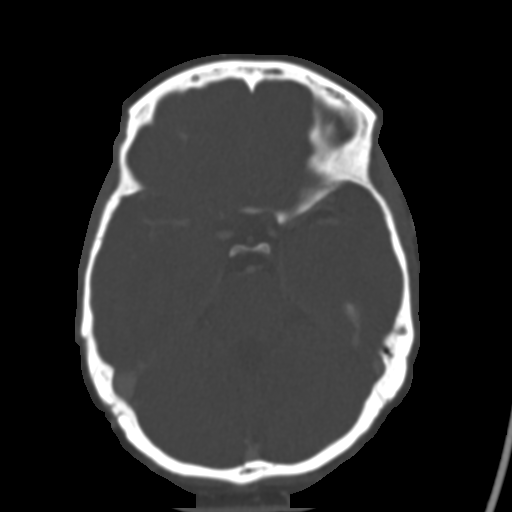
[im 72/78  bone]
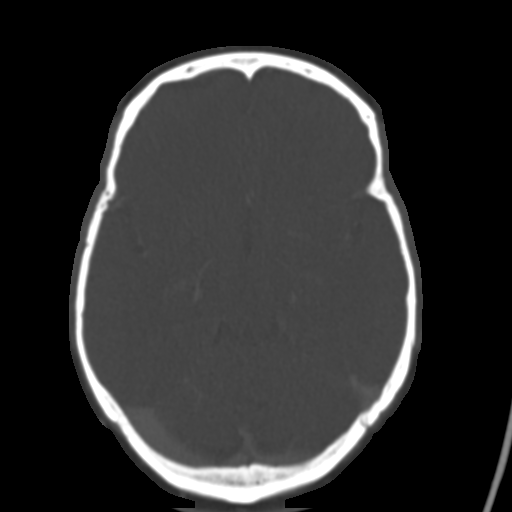

[Series 7: st cor · coronal · 0.32mm/px · 3 of 83 slices shown]
[im 21/83  bone]
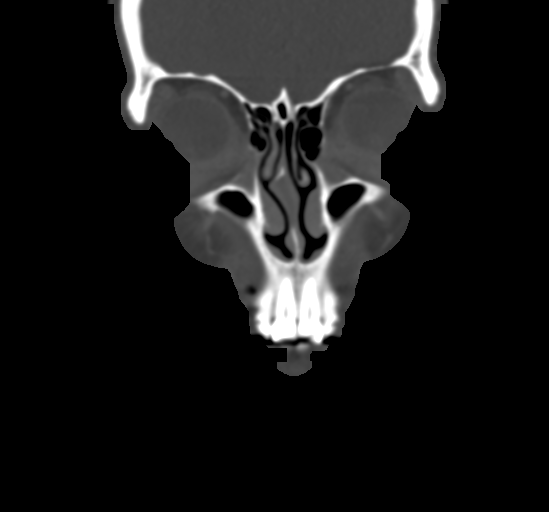
[im 42/83  bone]
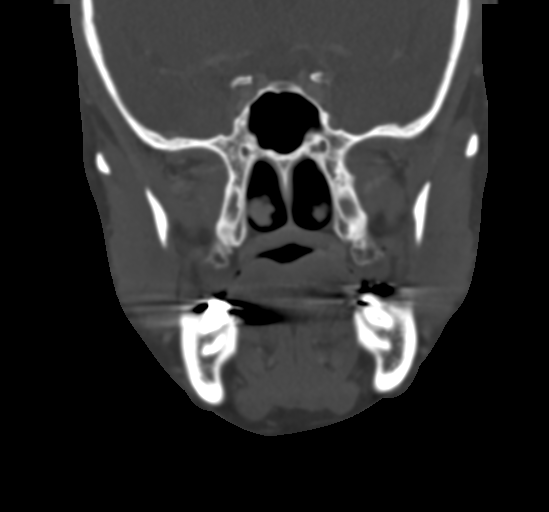
[im 62/83  bone]
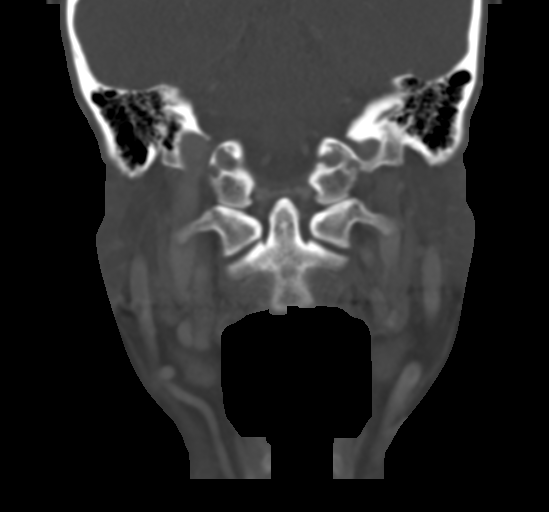

[Series 10: bone sag · sagittal · 0.31mm/px · 2 of 76 slices shown]
[im 26/76  bone]
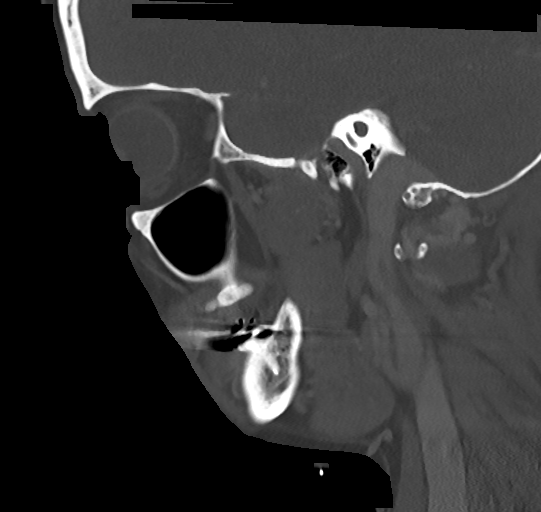
[im 51/76  bone]
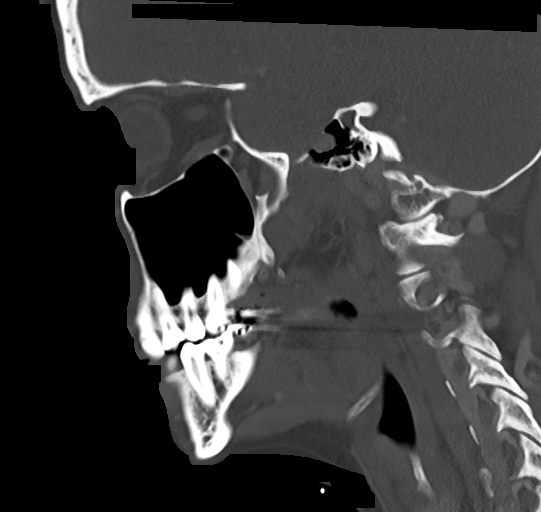

[16 of 47 positions shown; findings below may reference images not displayed]

FINDINGS: Paranasal sinuses:

Frontal: Hypoplastic right frontal sinus. Clear left frontal sinus
with patent drainage pathway.

Ethmoid: Normally aerated.

Maxillary: Normally aerated.

Sphenoid: Normally aerated. Patent sphenoethmoidal recesses.

Right ostiomeatal unit: Patent.

Left ostiomeatal unit: Patent maxillary antrostomy.

Nasal passages: Patent. Prior left uncinectomy and partial middle
turbinectomy. 3 mm rightward deviation of an intact nasal septum.

Anatomy: No pneumatization superior to anterior ethmoid notches.
Keros II. Sellar sphenoid pneumatization pattern. No dehiscence of
carotid or optic canals. No onodi cell.

Other: Clear mastoid air cells and tympanic cavities. Unremarkable
appearance of the orbits and included portion of the brain.
IMPRESSION: 1. Clear sinuses. Patent left maxillary antrostomy.
2. Rightward nasal septal deviation.

## 2020-08-29 ENCOUNTER — Telehealth: Payer: Self-pay | Admitting: Family Medicine

## 2020-08-29 MED ORDER — ALBUTEROL SULFATE HFA 108 (90 BASE) MCG/ACT IN AERS: 2.0000 | INHALATION_SPRAY | RESPIRATORY_TRACT | 0 refills | Status: DC | PRN

## 2020-08-29 MED ORDER — PREDNISONE 20 MG PO TABS: ORAL_TABLET | ORAL | 0 refills | Status: AC

## 2020-08-29 MED ORDER — GUAIFENESIN-CODEINE 100-10 MG/5ML PO SOLN: 5.0000 mL | Freq: Four times a day (QID) | ORAL | 0 refills | Status: AC | PRN

## 2020-08-29 NOTE — Telephone Encounter (Signed)
Pt called in, the past 3 days has had fever, bodyaches, chills, chest congestion with cough. Worse today. No SOB but feels tight in chest when she is coughing. 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 She is willing to pay for cough med out of pocket Discussed red flags when to find a close hospital  She had 30mg  of prednisone left over she took Will give prednisone taper Refilled albuterol Robitussin codiene sent

## 2020-09-01 ENCOUNTER — Other Ambulatory Visit: Payer: Self-pay | Admitting: Family Medicine

## 2020-09-12 ENCOUNTER — Other Ambulatory Visit: Payer: Self-pay | Admitting: Family Medicine

## 2022-05-27 ENCOUNTER — Encounter (HOSPITAL_COMMUNITY): Payer: Self-pay | Admitting: Emergency Medicine

## 2022-05-27 ENCOUNTER — Emergency Department (HOSPITAL_COMMUNITY)
Admission: EM | Admit: 2022-05-27 | Discharge: 2022-05-27 | Discharge status: Against Medical Advice | Payer: BLUE CROSS/BLUE SHIELD | Attending: Emergency Medicine | Admitting: Emergency Medicine

## 2022-05-27 ENCOUNTER — Other Ambulatory Visit: Payer: Self-pay

## 2022-05-27 ENCOUNTER — Emergency Department (HOSPITAL_COMMUNITY): Payer: BLUE CROSS/BLUE SHIELD

## 2022-05-27 DIAGNOSIS — R0602 Shortness of breath: Secondary | ICD-10-CM | POA: Diagnosis not present

## 2022-05-27 DIAGNOSIS — Z5321 Procedure and treatment not carried out due to patient leaving prior to being seen by health care provider: Secondary | ICD-10-CM | POA: Insufficient documentation

## 2022-05-27 DIAGNOSIS — R079 Chest pain, unspecified: Secondary | ICD-10-CM | POA: Diagnosis present

## 2022-05-27 DIAGNOSIS — R002 Palpitations: Secondary | ICD-10-CM | POA: Diagnosis not present

## 2022-05-27 LAB — CBC
HCT: 45.7 % (ref 36.0–46.0)
Hemoglobin: 15 g/dL (ref 12.0–15.0)
MCH: 30.7 pg (ref 26.0–34.0)
MCHC: 32.8 g/dL (ref 30.0–36.0)
MCV: 93.5 fL (ref 80.0–100.0)
Platelets: 230 10*3/uL (ref 150–400)
RBC: 4.89 MIL/uL (ref 3.87–5.11)
RDW: 13.5 % (ref 11.5–15.5)
WBC: 13.7 10*3/uL — ABNORMAL HIGH (ref 4.0–10.5)
nRBC: 0 % (ref 0.0–0.2)

## 2022-05-27 LAB — I-STAT BETA HCG BLOOD, ED (MC, WL, AP ONLY): I-stat hCG, quantitative: 5.9 m[IU]/mL — ABNORMAL HIGH (ref <5)

## 2022-05-27 LAB — TROPONIN I (HIGH SENSITIVITY): Troponin I (High Sensitivity): 80 ng/L — ABNORMAL HIGH (ref <18)

## 2022-05-27 LAB — BASIC METABOLIC PANEL
Anion gap: 9 (ref 5–15)
BUN: 14 mg/dL (ref 6–20)
CO2: 25 mmol/L (ref 22–32)
Calcium: 8.3 mg/dL — ABNORMAL LOW (ref 8.9–10.3)
Chloride: 102 mmol/L (ref 98–111)
Creatinine, Ser: 0.81 mg/dL (ref 0.44–1.00)
GFR, Estimated: >60 mL/min (ref >60)
Glucose, Bld: 130 mg/dL — ABNORMAL HIGH (ref 70–99)
Potassium: 4 mmol/L (ref 3.5–5.1)
Sodium: 136 mmol/L (ref 135–145)

## 2022-05-27 NOTE — ED Notes (Signed)
Ekg didn't cross over.

## 2022-05-27 NOTE — ED Notes (Signed)
This Probation officer called back to reassure her that if she came back her room would be held. Patient declined coming back.

## 2022-05-27 NOTE — ED Notes (Signed)
Patient called for room assignment. Patient left after this writer got her last set of vitals. Patient asked if the results could be sent to her doctor because she was not coming back to be seen.

## 2022-05-27 NOTE — ED Triage Notes (Signed)
Patient reports central chest pain with SOB and palpitations onset this week . No emesis or diaphoresis.
